# Patient Record
Sex: Male | Born: 1935 | Race: Black or African American | Hispanic: No | Marital: Married | Smoking: Former smoker
Health system: Southern US, Community
[De-identification: ages and names within clinical notes are randomized; demographics above are authoritative.]

## PROBLEM LIST (undated history)

## (undated) DIAGNOSIS — M109 Gout, unspecified: Secondary | ICD-10-CM

## (undated) DIAGNOSIS — M1990 Unspecified osteoarthritis, unspecified site: Secondary | ICD-10-CM

## (undated) DIAGNOSIS — G459 Transient cerebral ischemic attack, unspecified: Secondary | ICD-10-CM

## (undated) DIAGNOSIS — E039 Hypothyroidism, unspecified: Secondary | ICD-10-CM

## (undated) DIAGNOSIS — N289 Disorder of kidney and ureter, unspecified: Secondary | ICD-10-CM

## (undated) DIAGNOSIS — F0280 Dementia in other diseases classified elsewhere without behavioral disturbance: Secondary | ICD-10-CM

## (undated) DIAGNOSIS — G309 Alzheimer's disease, unspecified: Secondary | ICD-10-CM

## (undated) DIAGNOSIS — I10 Essential (primary) hypertension: Secondary | ICD-10-CM

## (undated) DIAGNOSIS — E7800 Pure hypercholesterolemia, unspecified: Secondary | ICD-10-CM

## (undated) DIAGNOSIS — D649 Anemia, unspecified: Secondary | ICD-10-CM

## (undated) DIAGNOSIS — K922 Gastrointestinal hemorrhage, unspecified: Secondary | ICD-10-CM

## (undated) DIAGNOSIS — N400 Enlarged prostate without lower urinary tract symptoms: Secondary | ICD-10-CM

## (undated) DIAGNOSIS — F32A Depression, unspecified: Secondary | ICD-10-CM

## (undated) DIAGNOSIS — I639 Cerebral infarction, unspecified: Secondary | ICD-10-CM

## (undated) DIAGNOSIS — K219 Gastro-esophageal reflux disease without esophagitis: Secondary | ICD-10-CM

## (undated) DIAGNOSIS — F0390 Unspecified dementia without behavioral disturbance: Secondary | ICD-10-CM

## (undated) DIAGNOSIS — F329 Major depressive disorder, single episode, unspecified: Secondary | ICD-10-CM

## (undated) DIAGNOSIS — J45909 Unspecified asthma, uncomplicated: Secondary | ICD-10-CM

## (undated) DIAGNOSIS — R0981 Nasal congestion: Secondary | ICD-10-CM

## (undated) DIAGNOSIS — Z9289 Personal history of other medical treatment: Secondary | ICD-10-CM

## (undated) HISTORY — PX: EYE SURGERY: SHX253

## (undated) HISTORY — PX: CATARACT EXTRACTION W/ INTRAOCULAR LENS  IMPLANT, BILATERAL: SHX1307

## (undated) HISTORY — PX: LITHOTRIPSY: SUR834

## (undated) HISTORY — PX: CYSTECTOMY: SUR359

---

## 1999-10-02 ENCOUNTER — Emergency Department (HOSPITAL_COMMUNITY): Admission: EM | Admit: 1999-10-02 | Discharge: 1999-10-02 | Payer: Self-pay | Admitting: Emergency Medicine

## 1999-10-09 ENCOUNTER — Encounter: Admission: RE | Admit: 1999-10-09 | Discharge: 1999-10-09 | Payer: Self-pay | Admitting: Internal Medicine

## 1999-10-28 ENCOUNTER — Emergency Department (HOSPITAL_COMMUNITY): Admission: EM | Admit: 1999-10-28 | Discharge: 1999-10-29 | Payer: Self-pay

## 2000-06-08 ENCOUNTER — Emergency Department (HOSPITAL_COMMUNITY): Admission: EM | Admit: 2000-06-08 | Discharge: 2000-06-08 | Payer: Self-pay | Admitting: Emergency Medicine

## 2000-06-08 ENCOUNTER — Encounter: Payer: Self-pay | Admitting: Emergency Medicine

## 2000-11-23 ENCOUNTER — Ambulatory Visit (HOSPITAL_COMMUNITY): Admission: RE | Admit: 2000-11-23 | Discharge: 2000-11-23 | Payer: Self-pay | Admitting: Specialist

## 2001-03-27 ENCOUNTER — Ambulatory Visit (HOSPITAL_COMMUNITY): Admission: RE | Admit: 2001-03-27 | Discharge: 2001-03-27 | Payer: Self-pay | Admitting: Specialist

## 2002-08-21 ENCOUNTER — Encounter: Payer: Self-pay | Admitting: Emergency Medicine

## 2002-08-21 ENCOUNTER — Emergency Department (HOSPITAL_COMMUNITY): Admission: EM | Admit: 2002-08-21 | Discharge: 2002-08-21 | Payer: Self-pay | Admitting: Emergency Medicine

## 2003-08-27 ENCOUNTER — Inpatient Hospital Stay (HOSPITAL_COMMUNITY): Admission: EM | Admit: 2003-08-27 | Discharge: 2003-09-03 | Payer: Self-pay

## 2003-09-26 ENCOUNTER — Ambulatory Visit (HOSPITAL_COMMUNITY): Admission: RE | Admit: 2003-09-26 | Discharge: 2003-09-26 | Payer: Self-pay | Admitting: Urology

## 2003-11-07 ENCOUNTER — Ambulatory Visit (HOSPITAL_COMMUNITY): Admission: RE | Admit: 2003-11-07 | Discharge: 2003-11-07 | Payer: Self-pay | Admitting: Urology

## 2004-03-19 ENCOUNTER — Inpatient Hospital Stay (HOSPITAL_COMMUNITY): Admission: EM | Admit: 2004-03-19 | Discharge: 2004-03-26 | Payer: Self-pay | Admitting: Emergency Medicine

## 2004-11-25 ENCOUNTER — Emergency Department (HOSPITAL_COMMUNITY): Admission: EM | Admit: 2004-11-25 | Discharge: 2004-11-25 | Payer: Self-pay | Admitting: Emergency Medicine

## 2005-03-14 ENCOUNTER — Emergency Department (HOSPITAL_COMMUNITY): Admission: EM | Admit: 2005-03-14 | Discharge: 2005-03-15 | Payer: Self-pay | Admitting: Emergency Medicine

## 2005-10-04 ENCOUNTER — Inpatient Hospital Stay (HOSPITAL_COMMUNITY): Admission: AD | Admit: 2005-10-04 | Discharge: 2005-10-06 | Payer: Self-pay | Admitting: Cardiovascular Disease

## 2005-10-05 ENCOUNTER — Encounter (INDEPENDENT_AMBULATORY_CARE_PROVIDER_SITE_OTHER): Payer: Self-pay | Admitting: Cardiovascular Disease

## 2005-12-20 ENCOUNTER — Emergency Department (HOSPITAL_COMMUNITY): Admission: EM | Admit: 2005-12-20 | Discharge: 2005-12-20 | Payer: Self-pay | Admitting: Emergency Medicine

## 2006-06-17 ENCOUNTER — Emergency Department (HOSPITAL_COMMUNITY): Admission: EM | Admit: 2006-06-17 | Discharge: 2006-06-17 | Payer: Self-pay | Admitting: *Deleted

## 2006-06-27 ENCOUNTER — Emergency Department (HOSPITAL_COMMUNITY): Admission: EM | Admit: 2006-06-27 | Discharge: 2006-06-27 | Payer: Self-pay | Admitting: Emergency Medicine

## 2006-10-26 ENCOUNTER — Observation Stay (HOSPITAL_COMMUNITY): Admission: EM | Admit: 2006-10-26 | Discharge: 2006-10-27 | Payer: Self-pay | Admitting: Emergency Medicine

## 2008-04-08 ENCOUNTER — Ambulatory Visit: Payer: Self-pay | Admitting: Vascular Surgery

## 2008-10-22 ENCOUNTER — Emergency Department (HOSPITAL_COMMUNITY): Admission: EM | Admit: 2008-10-22 | Discharge: 2008-10-22 | Payer: Self-pay | Admitting: Emergency Medicine

## 2008-10-23 ENCOUNTER — Inpatient Hospital Stay (HOSPITAL_COMMUNITY): Admission: EM | Admit: 2008-10-23 | Discharge: 2008-11-05 | Payer: Self-pay | Admitting: Emergency Medicine

## 2008-10-24 ENCOUNTER — Encounter (INDEPENDENT_AMBULATORY_CARE_PROVIDER_SITE_OTHER): Payer: Self-pay | Admitting: Internal Medicine

## 2008-10-24 ENCOUNTER — Ambulatory Visit: Payer: Self-pay | Admitting: Internal Medicine

## 2009-04-06 ENCOUNTER — Inpatient Hospital Stay (HOSPITAL_COMMUNITY): Admission: EM | Admit: 2009-04-06 | Discharge: 2009-04-08 | Payer: Self-pay | Admitting: Emergency Medicine

## 2010-02-15 ENCOUNTER — Encounter: Payer: Self-pay | Admitting: Urology

## 2010-02-15 ENCOUNTER — Encounter: Payer: Self-pay | Admitting: Internal Medicine

## 2010-04-17 LAB — BASIC METABOLIC PANEL
BUN: 21 mg/dL (ref 6–23)
CO2: 27 mEq/L (ref 19–32)
Calcium: 9 mg/dL (ref 8.4–10.5)
Chloride: 105 mEq/L (ref 96–112)
Chloride: 106 mEq/L (ref 96–112)
Chloride: 109 mEq/L (ref 96–112)
Creatinine, Ser: 2.25 mg/dL — ABNORMAL HIGH (ref 0.4–1.5)
Creatinine, Ser: 2.29 mg/dL — ABNORMAL HIGH (ref 0.4–1.5)
GFR calc Af Amer: 34 mL/min — ABNORMAL LOW (ref >60)
GFR calc Af Amer: 35 mL/min — ABNORMAL LOW (ref >60)
GFR calc non Af Amer: 27 mL/min — ABNORMAL LOW (ref >60)
GFR calc non Af Amer: 29 mL/min — ABNORMAL LOW (ref >60)
Glucose, Bld: 85 mg/dL (ref 70–99)
Potassium: 4.5 mEq/L (ref 3.5–5.1)
Sodium: 139 mEq/L (ref 135–145)
Sodium: 139 mEq/L (ref 135–145)
Sodium: 140 mEq/L (ref 135–145)

## 2010-04-17 LAB — CARDIAC PANEL(CRET KIN+CKTOT+MB+TROPI)
CK, MB: 2.2 ng/mL (ref 0.3–4.0)
Relative Index: 1.4 (ref 0.0–2.5)
Relative Index: 1.4 (ref 0.0–2.5)
Total CK: 160 U/L (ref 7–232)
Troponin I: 0.01 ng/mL (ref 0.00–0.06)
Troponin I: 0.02 ng/mL (ref 0.00–0.06)
Troponin I: <0.01 ng/mL (ref 0.00–0.06)

## 2010-04-17 LAB — COMPREHENSIVE METABOLIC PANEL
AST: 33 U/L (ref 0–37)
BUN: 20 mg/dL (ref 6–23)
CO2: 26 mEq/L (ref 19–32)
Chloride: 105 mEq/L (ref 96–112)
Creatinine, Ser: 2.24 mg/dL — ABNORMAL HIGH (ref 0.4–1.5)
Glucose, Bld: 124 mg/dL — ABNORMAL HIGH (ref 70–99)
Potassium: 4.3 mEq/L (ref 3.5–5.1)
Total Bilirubin: 0.6 mg/dL (ref 0.3–1.2)

## 2010-04-17 LAB — LIPID PANEL
LDL Cholesterol: 76 mg/dL (ref 0–99)
Total CHOL/HDL Ratio: 2.8 RATIO
Triglycerides: 51 mg/dL (ref <150)
VLDL: 10 mg/dL (ref 0–40)

## 2010-04-17 LAB — TROPONIN I: Troponin I: 0.02 ng/mL (ref 0.00–0.06)

## 2010-04-17 LAB — DIFFERENTIAL
Basophils Absolute: 0 10*3/uL (ref 0.0–0.1)
Basophils Relative: 0 % (ref 0–1)
Eosinophils Absolute: 0.2 10*3/uL (ref 0.0–0.7)
Lymphs Abs: 1.4 10*3/uL (ref 0.7–4.0)
Monocytes Absolute: 0.6 10*3/uL (ref 0.1–1.0)
Neutrophils Relative %: 59 % (ref 43–77)

## 2010-04-17 LAB — POCT CARDIAC MARKERS: CKMB, poc: 1.7 ng/mL (ref 1.0–8.0)

## 2010-04-17 LAB — CBC
Hemoglobin: 13.1 g/dL (ref 13.0–17.0)
MCHC: 31.8 g/dL (ref 30.0–36.0)
MCHC: 32.2 g/dL (ref 30.0–36.0)
MCV: 96.7 fL (ref 78.0–100.0)
Platelets: 176 10*3/uL (ref 150–400)
RBC: 4.28 MIL/uL (ref 4.22–5.81)
RDW: 16.6 % — ABNORMAL HIGH (ref 11.5–15.5)

## 2010-04-17 LAB — CK TOTAL AND CKMB (NOT AT ARMC): CK, MB: 2.5 ng/mL (ref 0.3–4.0)

## 2010-04-17 LAB — HEMOCCULT GUIAC POC 1CARD (OFFICE): Fecal Occult Bld: NEGATIVE

## 2010-04-17 LAB — APTT: aPTT: 23 seconds — ABNORMAL LOW (ref 24–37)

## 2010-04-17 LAB — PROTIME-INR: Prothrombin Time: 13.6 seconds (ref 11.6–15.2)

## 2010-04-30 LAB — CBC
HCT: 23.3 % — ABNORMAL LOW (ref 39.0–52.0)
HCT: 25 % — ABNORMAL LOW (ref 39.0–52.0)
HCT: 25.4 % — ABNORMAL LOW (ref 39.0–52.0)
HCT: 25.9 % — ABNORMAL LOW (ref 39.0–52.0)
HCT: 26.1 % — ABNORMAL LOW (ref 39.0–52.0)
HCT: 26.2 % — ABNORMAL LOW (ref 39.0–52.0)
HCT: 26.2 % — ABNORMAL LOW (ref 39.0–52.0)
HCT: 26.9 % — ABNORMAL LOW (ref 39.0–52.0)
HCT: 27 % — ABNORMAL LOW (ref 39.0–52.0)
HCT: 29.3 % — ABNORMAL LOW (ref 39.0–52.0)
Hemoglobin: 7.9 g/dL — CL (ref 13.0–17.0)
Hemoglobin: 8.6 g/dL — ABNORMAL LOW (ref 13.0–17.0)
Hemoglobin: 8.8 g/dL — ABNORMAL LOW (ref 13.0–17.0)
Hemoglobin: 8.8 g/dL — ABNORMAL LOW (ref 13.0–17.0)
Hemoglobin: 8.8 g/dL — ABNORMAL LOW (ref 13.0–17.0)
Hemoglobin: 8.9 g/dL — ABNORMAL LOW (ref 13.0–17.0)
Hemoglobin: 9 g/dL — ABNORMAL LOW (ref 13.0–17.0)
Hemoglobin: 9 g/dL — ABNORMAL LOW (ref 13.0–17.0)
Hemoglobin: 9.1 g/dL — ABNORMAL LOW (ref 13.0–17.0)
Hemoglobin: 9.2 g/dL — ABNORMAL LOW (ref 13.0–17.0)
Hemoglobin: 9.8 g/dL — ABNORMAL LOW (ref 13.0–17.0)
MCHC: 33.4 g/dL (ref 30.0–36.0)
MCHC: 33.7 g/dL (ref 30.0–36.0)
MCHC: 33.9 g/dL (ref 30.0–36.0)
MCHC: 33.9 g/dL (ref 30.0–36.0)
MCHC: 34.1 g/dL (ref 30.0–36.0)
MCHC: 34.5 g/dL (ref 30.0–36.0)
MCHC: 34.6 g/dL (ref 30.0–36.0)
MCHC: 34.7 g/dL (ref 30.0–36.0)
MCV: 88.8 fL (ref 78.0–100.0)
MCV: 89.4 fL (ref 78.0–100.0)
MCV: 89.9 fL (ref 78.0–100.0)
MCV: 89.9 fL (ref 78.0–100.0)
MCV: 90.4 fL (ref 78.0–100.0)
MCV: 90.6 fL (ref 78.0–100.0)
Platelets: 131 10*3/uL — ABNORMAL LOW (ref 150–400)
Platelets: 140 10*3/uL — ABNORMAL LOW (ref 150–400)
Platelets: 178 10*3/uL (ref 150–400)
Platelets: 220 10*3/uL (ref 150–400)
Platelets: 259 10*3/uL (ref 150–400)
Platelets: 267 10*3/uL (ref 150–400)
Platelets: 281 10*3/uL (ref 150–400)
Platelets: 323 10*3/uL (ref 150–400)
RBC: 2.67 MIL/uL — ABNORMAL LOW (ref 4.22–5.81)
RBC: 2.78 MIL/uL — ABNORMAL LOW (ref 4.22–5.81)
RBC: 2.85 MIL/uL — ABNORMAL LOW (ref 4.22–5.81)
RBC: 2.9 MIL/uL — ABNORMAL LOW (ref 4.22–5.81)
RBC: 2.92 MIL/uL — ABNORMAL LOW (ref 4.22–5.81)
RBC: 2.95 MIL/uL — ABNORMAL LOW (ref 4.22–5.81)
RBC: 2.95 MIL/uL — ABNORMAL LOW (ref 4.22–5.81)
RBC: 3.24 MIL/uL — ABNORMAL LOW (ref 4.22–5.81)
RDW: 15.6 % — ABNORMAL HIGH (ref 11.5–15.5)
RDW: 15.9 % — ABNORMAL HIGH (ref 11.5–15.5)
RDW: 15.9 % — ABNORMAL HIGH (ref 11.5–15.5)
RDW: 16 % — ABNORMAL HIGH (ref 11.5–15.5)
RDW: 16.1 % — ABNORMAL HIGH (ref 11.5–15.5)
RDW: 16.1 % — ABNORMAL HIGH (ref 11.5–15.5)
RDW: 16.2 % — ABNORMAL HIGH (ref 11.5–15.5)
RDW: 16.2 % — ABNORMAL HIGH (ref 11.5–15.5)
RDW: 16.3 % — ABNORMAL HIGH (ref 11.5–15.5)
RDW: 16.5 % — ABNORMAL HIGH (ref 11.5–15.5)
RDW: 17.3 % — ABNORMAL HIGH (ref 11.5–15.5)
WBC: 10.5 10*3/uL (ref 4.0–10.5)
WBC: 11.5 10*3/uL — ABNORMAL HIGH (ref 4.0–10.5)
WBC: 12.1 10*3/uL — ABNORMAL HIGH (ref 4.0–10.5)
WBC: 12.5 10*3/uL — ABNORMAL HIGH (ref 4.0–10.5)
WBC: 13.4 10*3/uL — ABNORMAL HIGH (ref 4.0–10.5)
WBC: 14.2 10*3/uL — ABNORMAL HIGH (ref 4.0–10.5)

## 2010-04-30 LAB — URINE CULTURE
Colony Count: NO GROWTH
Special Requests: NEGATIVE
Special Requests: NEGATIVE

## 2010-04-30 LAB — URINE MICROSCOPIC-ADD ON

## 2010-04-30 LAB — URINALYSIS, ROUTINE W REFLEX MICROSCOPIC
Bilirubin Urine: NEGATIVE
Bilirubin Urine: NEGATIVE
Glucose, UA: NEGATIVE mg/dL
Glucose, UA: NEGATIVE mg/dL
Ketones, ur: NEGATIVE mg/dL
Ketones, ur: NEGATIVE mg/dL
Protein, ur: 100 mg/dL — AB
pH: 5 (ref 5.0–8.0)
pH: 6.5 (ref 5.0–8.0)

## 2010-04-30 LAB — DIFFERENTIAL
Basophils Absolute: 0 10*3/uL (ref 0.0–0.1)
Basophils Relative: 0 % (ref 0–1)
Eosinophils Absolute: 0.5 10*3/uL (ref 0.0–0.7)
Eosinophils Relative: 1 % (ref 0–5)
Lymphocytes Relative: 7 % — ABNORMAL LOW (ref 12–46)
Lymphs Abs: 0.8 10*3/uL (ref 0.7–4.0)
Monocytes Absolute: 0.7 10*3/uL (ref 0.1–1.0)
Monocytes Absolute: 0.8 10*3/uL (ref 0.1–1.0)
Monocytes Relative: 7 % (ref 3–12)
Neutro Abs: 8.3 10*3/uL — ABNORMAL HIGH (ref 1.7–7.7)
Neutro Abs: 9.8 10*3/uL — ABNORMAL HIGH (ref 1.7–7.7)

## 2010-04-30 LAB — CARDIAC PANEL(CRET KIN+CKTOT+MB+TROPI)
CK, MB: 1.9 ng/mL (ref 0.3–4.0)
CK, MB: 2.2 ng/mL (ref 0.3–4.0)
Relative Index: 1.4 (ref 0.0–2.5)
Relative Index: 1.9 (ref 0.0–2.5)
Total CK: 100 U/L (ref 7–232)
Total CK: 125 U/L (ref 7–232)
Troponin I: 0.01 ng/mL (ref 0.00–0.06)
Troponin I: <0.01 ng/mL (ref 0.00–0.06)

## 2010-04-30 LAB — BASIC METABOLIC PANEL
BUN: 15 mg/dL (ref 6–23)
BUN: 15 mg/dL (ref 6–23)
BUN: 16 mg/dL (ref 6–23)
BUN: 20 mg/dL (ref 6–23)
BUN: 25 mg/dL — ABNORMAL HIGH (ref 6–23)
CO2: 22 mEq/L (ref 19–32)
CO2: 23 mEq/L (ref 19–32)
CO2: 24 mEq/L (ref 19–32)
Calcium: 7.5 mg/dL — ABNORMAL LOW (ref 8.4–10.5)
Calcium: 7.6 mg/dL — ABNORMAL LOW (ref 8.4–10.5)
Calcium: 8.1 mg/dL — ABNORMAL LOW (ref 8.4–10.5)
Calcium: 8.1 mg/dL — ABNORMAL LOW (ref 8.4–10.5)
Calcium: 8.3 mg/dL — ABNORMAL LOW (ref 8.4–10.5)
Calcium: 8.4 mg/dL (ref 8.4–10.5)
Calcium: 8.4 mg/dL (ref 8.4–10.5)
Chloride: 108 mEq/L (ref 96–112)
Chloride: 110 mEq/L (ref 96–112)
Creatinine, Ser: 2.04 mg/dL — ABNORMAL HIGH (ref 0.4–1.5)
Creatinine, Ser: 2.16 mg/dL — ABNORMAL HIGH (ref 0.4–1.5)
Creatinine, Ser: 2.2 mg/dL — ABNORMAL HIGH (ref 0.4–1.5)
Creatinine, Ser: 2.39 mg/dL — ABNORMAL HIGH (ref 0.4–1.5)
GFR calc Af Amer: 31 mL/min — ABNORMAL LOW (ref >60)
GFR calc Af Amer: 37 mL/min — ABNORMAL LOW (ref >60)
GFR calc Af Amer: 38 mL/min — ABNORMAL LOW (ref >60)
GFR calc Af Amer: 38 mL/min — ABNORMAL LOW (ref >60)
GFR calc non Af Amer: 28 mL/min — ABNORMAL LOW (ref >60)
GFR calc non Af Amer: 29 mL/min — ABNORMAL LOW (ref >60)
GFR calc non Af Amer: 30 mL/min — ABNORMAL LOW (ref >60)
GFR calc non Af Amer: 31 mL/min — ABNORMAL LOW (ref >60)
GFR calc non Af Amer: 31 mL/min — ABNORMAL LOW (ref >60)
GFR calc non Af Amer: 31 mL/min — ABNORMAL LOW (ref >60)
GFR calc non Af Amer: 31 mL/min — ABNORMAL LOW (ref >60)
Glucose, Bld: 105 mg/dL — ABNORMAL HIGH (ref 70–99)
Glucose, Bld: 111 mg/dL — ABNORMAL HIGH (ref 70–99)
Glucose, Bld: 112 mg/dL — ABNORMAL HIGH (ref 70–99)
Glucose, Bld: 121 mg/dL — ABNORMAL HIGH (ref 70–99)
Glucose, Bld: 122 mg/dL — ABNORMAL HIGH (ref 70–99)
Glucose, Bld: 123 mg/dL — ABNORMAL HIGH (ref 70–99)
Glucose, Bld: 127 mg/dL — ABNORMAL HIGH (ref 70–99)
Glucose, Bld: 152 mg/dL — ABNORMAL HIGH (ref 70–99)
Potassium: 3.4 mEq/L — ABNORMAL LOW (ref 3.5–5.1)
Potassium: 3.4 mEq/L — ABNORMAL LOW (ref 3.5–5.1)
Potassium: 3.7 mEq/L (ref 3.5–5.1)
Potassium: 3.9 mEq/L (ref 3.5–5.1)
Potassium: 4 mEq/L (ref 3.5–5.1)
Sodium: 137 mEq/L (ref 135–145)
Sodium: 138 mEq/L (ref 135–145)
Sodium: 139 mEq/L (ref 135–145)
Sodium: 140 mEq/L (ref 135–145)
Sodium: 140 mEq/L (ref 135–145)

## 2010-04-30 LAB — CROSSMATCH: ABO/RH(D): A NEG

## 2010-04-30 LAB — CULTURE, BLOOD (ROUTINE X 2): Culture: NO GROWTH

## 2010-04-30 LAB — PREPARE PLATELETS

## 2010-04-30 LAB — HEMOGLOBIN: Hemoglobin: 8.9 g/dL — ABNORMAL LOW (ref 13.0–17.0)

## 2010-04-30 LAB — GLUCOSE, CAPILLARY

## 2010-05-01 LAB — POCT CARDIAC MARKERS
CKMB, poc: 1.3 ng/mL (ref 1.0–8.0)
CKMB, poc: <1 ng/mL — ABNORMAL LOW (ref 1.0–8.0)
Myoglobin, poc: 196 ng/mL (ref 12–200)
Myoglobin, poc: 200 ng/mL (ref 12–200)

## 2010-05-01 LAB — TYPE AND SCREEN: Antibody Screen: NEGATIVE

## 2010-05-01 LAB — BASIC METABOLIC PANEL
BUN: 35 mg/dL — ABNORMAL HIGH (ref 6–23)
BUN: 36 mg/dL — ABNORMAL HIGH (ref 6–23)
CO2: 17 mEq/L — ABNORMAL LOW (ref 19–32)
Chloride: 113 mEq/L — ABNORMAL HIGH (ref 96–112)
GFR calc non Af Amer: 24 mL/min — ABNORMAL LOW (ref >60)
Glucose, Bld: 156 mg/dL — ABNORMAL HIGH (ref 70–99)
Glucose, Bld: 220 mg/dL — ABNORMAL HIGH (ref 70–99)
Potassium: 4.4 mEq/L (ref 3.5–5.1)
Potassium: 5 mEq/L (ref 3.5–5.1)

## 2010-05-01 LAB — CBC
HCT: 25 % — ABNORMAL LOW (ref 39.0–52.0)
Hemoglobin: 10.1 g/dL — ABNORMAL LOW (ref 13.0–17.0)
Hemoglobin: 14.8 g/dL (ref 13.0–17.0)
MCHC: 33.4 g/dL (ref 30.0–36.0)
MCHC: 33.4 g/dL (ref 30.0–36.0)
MCHC: 33.5 g/dL (ref 30.0–36.0)
MCHC: 33.9 g/dL (ref 30.0–36.0)
MCV: 90.9 fL (ref 78.0–100.0)
MCV: 91.1 fL (ref 78.0–100.0)
MCV: 94.3 fL (ref 78.0–100.0)
MCV: 98.7 fL (ref 78.0–100.0)
Platelets: 124 10*3/uL — ABNORMAL LOW (ref 150–400)
Platelets: 149 10*3/uL — ABNORMAL LOW (ref 150–400)
Platelets: 175 10*3/uL (ref 150–400)
Platelets: 188 10*3/uL (ref 150–400)
Platelets: 193 10*3/uL (ref 150–400)
Platelets: 209 10*3/uL (ref 150–400)
RBC: 2.59 MIL/uL — ABNORMAL LOW (ref 4.22–5.81)
RBC: 3.32 MIL/uL — ABNORMAL LOW (ref 4.22–5.81)
RBC: 3.33 MIL/uL — ABNORMAL LOW (ref 4.22–5.81)
RBC: 3.39 MIL/uL — ABNORMAL LOW (ref 4.22–5.81)
RDW: 14.3 % (ref 11.5–15.5)
RDW: 18.4 % — ABNORMAL HIGH (ref 11.5–15.5)
RDW: 20.5 % — ABNORMAL HIGH (ref 11.5–15.5)
WBC: 15.1 10*3/uL — ABNORMAL HIGH (ref 4.0–10.5)
WBC: 15.9 10*3/uL — ABNORMAL HIGH (ref 4.0–10.5)

## 2010-05-01 LAB — COMPREHENSIVE METABOLIC PANEL
ALT: 20 U/L (ref 0–53)
ALT: 21 U/L (ref 0–53)
AST: 25 U/L (ref 0–37)
AST: 26 U/L (ref 0–37)
Albumin: 3.1 g/dL — ABNORMAL LOW (ref 3.5–5.2)
Albumin: 3.6 g/dL (ref 3.5–5.2)
Alkaline Phosphatase: 101 U/L (ref 39–117)
CO2: 19 mEq/L (ref 19–32)
Calcium: 8.7 mg/dL (ref 8.4–10.5)
Creatinine, Ser: 2.92 mg/dL — ABNORMAL HIGH (ref 0.4–1.5)
GFR calc Af Amer: 25 mL/min — ABNORMAL LOW (ref >60)
GFR calc Af Amer: 28 mL/min — ABNORMAL LOW (ref >60)
GFR calc non Af Amer: 21 mL/min — ABNORMAL LOW (ref >60)
Potassium: 5 mEq/L (ref 3.5–5.1)
Sodium: 140 mEq/L (ref 135–145)
Sodium: 141 mEq/L (ref 135–145)
Total Protein: 5.9 g/dL — ABNORMAL LOW (ref 6.0–8.3)
Total Protein: 7.2 g/dL (ref 6.0–8.3)

## 2010-05-01 LAB — PROTIME-INR
INR: 1.3 (ref 0.00–1.49)
Prothrombin Time: 15.2 seconds (ref 11.6–15.2)
Prothrombin Time: 16.3 seconds — ABNORMAL HIGH (ref 11.6–15.2)

## 2010-05-01 LAB — DIFFERENTIAL
Basophils Relative: 0 % (ref 0–1)
Eosinophils Absolute: 0 10*3/uL (ref 0.0–0.7)
Eosinophils Absolute: 0 10*3/uL (ref 0.0–0.7)
Eosinophils Relative: 0 % (ref 0–5)
Lymphocytes Relative: 19 % (ref 12–46)
Lymphs Abs: 1 10*3/uL (ref 0.7–4.0)
Lymphs Abs: 1.5 10*3/uL (ref 0.7–4.0)
Lymphs Abs: 1.9 10*3/uL (ref 0.7–4.0)
Monocytes Absolute: 0.4 10*3/uL (ref 0.1–1.0)
Monocytes Absolute: 0.6 10*3/uL (ref 0.1–1.0)
Monocytes Relative: 6 % (ref 3–12)
Monocytes Relative: 6 % (ref 3–12)
Monocytes Relative: 9 % (ref 3–12)
Neutro Abs: 12.2 10*3/uL — ABNORMAL HIGH (ref 1.7–7.7)
Neutrophils Relative %: 81 % — ABNORMAL HIGH (ref 43–77)

## 2010-05-01 LAB — CARDIAC PANEL(CRET KIN+CKTOT+MB+TROPI)
Relative Index: INVALID (ref 0.0–2.5)
Relative Index: INVALID (ref 0.0–2.5)
Relative Index: INVALID (ref 0.0–2.5)
Total CK: 78 U/L (ref 7–232)
Troponin I: <0.01 ng/mL (ref 0.00–0.06)

## 2010-05-01 LAB — URINALYSIS, ROUTINE W REFLEX MICROSCOPIC
Glucose, UA: NEGATIVE mg/dL
Ketones, ur: NEGATIVE mg/dL
Protein, ur: NEGATIVE mg/dL
pH: 5.5 (ref 5.0–8.0)

## 2010-05-01 LAB — URINE MICROSCOPIC-ADD ON

## 2010-05-01 LAB — PREPARE PLATELETS

## 2010-05-01 LAB — APTT: aPTT: 25 seconds (ref 24–37)

## 2010-05-01 LAB — RETICULOCYTES
RBC.: 3.34 MIL/uL — ABNORMAL LOW (ref 4.22–5.81)
Retic Count, Absolute: 50.1 10*3/uL (ref 19.0–186.0)

## 2010-05-01 LAB — IRON AND TIBC: Iron: 87 ug/dL (ref 42–135)

## 2010-05-01 LAB — VITAMIN B12: Vitamin B-12: 1020 pg/mL — ABNORMAL HIGH (ref 211–911)

## 2010-05-01 LAB — URINE CULTURE
Colony Count: NO GROWTH
Culture: NO GROWTH

## 2010-05-01 LAB — LIPID PANEL
Cholesterol: 80 mg/dL (ref 0–200)
LDL Cholesterol: 42 mg/dL (ref 0–99)
Total CHOL/HDL Ratio: 5 RATIO

## 2010-05-01 LAB — HEMOCCULT GUIAC POC 1CARD (OFFICE): Fecal Occult Bld: POSITIVE

## 2010-06-12 NOTE — Discharge Summary (Signed)
[name] [name], HEWES NO.:  [id number]   MEDICAL RECORD NO.:  [id number]          PATIENT TYPE:  OBV   LOCATION:  2027                         FACILITY:  [facility]   PHYSICIAN:  Ricki Rodriguez, M.D.  DATE OF BIRTH:  1919/08/05   DATE OF ADMISSION:  10/26/2006  DATE OF DISCHARGE:  10/27/2006                               DISCHARGE SUMMARY   FINAL DIAGNOSES:  1. Chest pain.  2. Hypertension.  3. Dementia.  4. Hyperlipidemia.  5. Obesity.   DISCHARGE MEDICATIONS:  1. Aspirin 81 mg one daily.  2. Plavix 75 mg one daily.  3. Nortriptyline 25 mg at bedtime.  4. Zocor 20 mg one daily.  5. Atacand 8 mg one daily.  6. Mirtazapine 45 mg at bedtime.  7. Aricept 10 mg daily.  8. Namenda 10 mg one twice daily.  9. Prilosec 20 mg one daily.  10.Flomax 0.4 mg one daily.  11.Vicodin 5/500 mg one twice daily as needed.  12.Robaxin 500 mg one twice daily as needed.  13.Bactrim DS one daily.  14.Nitrofurantoin 50 mg one every other day.  15.Skelaxin 800 mg twice daily as needed.  16.Lorazepam 1 mg one daily as needed.  17.Multivitamin one daily.   FOLLOW-UP:  1. By Dr. Orpah Cobb in 2 weeks.  2. By Dr. Concepcion Elk in 1-2 months.   The patient and family to call the doctor's offices respectively for  appointment.   DISCHARGE DIET:  Low-sodium, heart-healthy diet.   DISCHARGE ACTIVITY:  The patient to increase activity slowly and to  avoid any activity that causes chest pain, shortness of breath,  dizziness, sweating or excessive weakness.   HISTORY:  This 77 year old black male presented with left-sided chest  pain x2 days without shortness of breath or radiation.  Patient unable  to provide a good history due to dementia.   PHYSICAL EXAMINATION:  Temperature 97, pulse 97, respiration 18, blood  pressure 144/76.  GENERAL:  The patient is normocephalic, atraumatic, with frontal  baldness and wears a hat.  EYES:  Manson Passey.  Pupils equally reactive to light.   Extraocular movement  intact.  A positive arcus senilis.  Wears upper and lower dentures.  Has  bilateral lens implants.  NECK:  No JVD, no carotid bruit.  LUNGS:  Clear bilaterally.  HEART:  Normal S1-S2.  ABDOMEN:  Soft and nontender.  EXTREMITIES:  Trace edema without cyanosis or clubbing.  SKIN:  Warm and dry.  NEUROLOGIC:  The patient moves all four extremities.   LABORATORY DATA:  Normal hemoglobin, hematocrit, WBC count, platelet  count.  Normal electrolytes, BUN 22, creatinine elevated at 2.1, glucose  113.  CK, MB negative x3 and troponin negative x3.   Chest x-ray:  No acute cardiopulmonary abnormality.   HOSPITAL COURSE:  The patient was placed in observation unit.  His CK,  MB and troponin I were negative x3.  He was offered cardiac  catheterization versus nuclear stress test.  The patient declined any  additional workup at this time and he was discharged home in  satisfactory condition with follow-up by me in 2 weeks.  Ricki Rodriguez, M.D.  Electronically Signed     ASK/MEDQ  D:  12/05/2006  T:  12/06/2006  Job:  [id number]

## 2010-06-12 NOTE — Consult Note (Signed)
NAME:  [name] [name], [name] [name] NO.:  [id number]   MEDICAL RECORD NO.:  [id number]                   PATIENT TYPE:  INP   LOCATION:  13                                 FACILITY:  [facility]   PHYSICIAN:  Rozanna Boer., M.D.      DATE OF BIRTH:  11/14/1917   DATE OF CONSULTATION:  08/28/2003  DATE OF DISCHARGE:                                   CONSULTATION   REFERRING PHYSICIAN:  Fleet Contras, M.D.   REASON FOR CONSULTATION:  Right hydronephrosis, gram-negative septicemia,  and bilateral renal calculi, urinary retention, elevated PSA.   This 77 year old patient was admitted yesterday with a 105 fever from the  ER.  He had had pain in the left flank but he had been seen by Dr. Concepcion Elk  on July 26 with similar complaints but pain was less severe.  He had some  urinary frequency, incontinence, and cloudy, foul-smelling urine at that  time.  A CT scan of the abdomen was requested, but the patient never showed  up.  The patient's wife said that he had not been eating or drinking much  lately and felt more and more weak and went to the bathroom frequently.  He  went to the ER, where his temperature was 105, his creatinine was 2.3, and  he had a leukocytosis on his CBC.  He was admitted for further treatment and  care.  Since he has been in the hospital, an ultrasound showed right renal  obstruction with bilateral renal calculi.  The hydronephrosis was mild on  the right side.  Later today the lab called and said he had gram-negative  septicemia growing out of his blood culture, and he has been in urinary  retention with a Foley catheter.  He also has a history of an elevated PSA  in the past.  It was 7.6 last winter.  His white count has gone up to  21,000, and his creatinine history gone up from 2.3 to 3.1 today.  A CT scan  stone protocol has been ordered but has not yet been done.   The patient's wife said that he had a previous lithotripsy in [place]  about for or five years ago but no etiology of his stones was determined,  and she does not know whether he had other stones at that time.  He has had  urinary tract infection and was treated a month or two ago as well.   His other medical history:  1. He does have Alzheimer's disease with dementia.  2. History of Parkinson's disease.  3. History of depression.  4. History of elevated PSA with BPH.  5. History of GERD.  6. History of a liver nodule.   His medicines on admission were:  1. Aricept 5 mg daily.  2. Namenda 10 mg two p.o. b.i.d.  3. Aspirin 81 mg.  4. Lexapro 10 mg a day.  5. Nexium 40 mg a  day.  6. Zocor 20 mg a day.   He has no known drug allergies.   No other operations other than a cyst on his neck, which was removed  sometime in the past.   FAMILY HISTORY, SOCIAL HISTORY, AND REVIEW OF SYSTEMS:  He lives with his  wife.  He has seven children in good health.  His daughter is with him as  well as his son-in-law.  Does not smoke or drink alcohol   He has no cardiac or pulmonary symptomatology.  His dementia has been  getting worse and according to his wife, he does not like doctors and is  very noncompliant by history.   PHYSICAL EXAMINATION:  VITAL SIGNS:  His temperature is 100.5, his blood  pressure is 95/62, pulse 108, respirations 20.  GENERAL:  He is a fairly confused, somewhat combative but afebrile black  male in no acute distress.  Head is shaved.  HEENT:  Clear.  No lesions of the oropharynx.  NECK:  Supple.  A scar on his neck from previous surgery.  CHEST:  Clear.  ABDOMEN:  Soft.  Liver, spleen not palpable.  No CVA pain on either side.  Bladder is not distended.  GENITOURINARY:  His penis is uncircumcised with bilaterally descended  testes.  His Foley catheter is in place, draining clear urine.  His prostate  is not examined at this time for patient not wanting me to do so.  EXTREMITIES:  Negative, no edema, good distal pulses.    IMPRESSION:  1. Bilateral renal calculi.  2. Right hydronephrosis.  3. Obstructive uropathy.  4. Azotemia.  5. Gram-negative sepsis.  6. Elevated prostate-specific antigen by history.  7. Alzheimer's with dementia.   RECOMMENDATIONS:  The patient seems stable enough now to wait and do  cystoscopy and stent placement and possible ureteroscopy tomorrow at [facility].  This was scheduled for 12 noon.  Will arrange for [facility] transfer  to take him over and back.  That way all the equipment I would need would be  available, which is not available at [facility] here at this time.  Further  disposition will be made after the cystoscopy and retrogrades and stent  placement and possible ureteroscopy.                                               Rozanna Boer., M.D.    HMK/MEDQ  D:  08/28/2003  T:  08/29/2003  Job:  [id number]   cc:   Fleet Contras, M.D.  [address]  [place]  [place] [address]  Fax: [phone]

## 2010-06-12 NOTE — H&P (Signed)
NAME:  [name] [name], [name] [name] NO.:  [id number]   MEDICAL RECORD NO.:  [id number]                   PATIENT TYPE:  OUT   LOCATION:  CATS                                 FACILITY:  [facility]   PHYSICIAN:  Fleet Contras, M.D.                 DATE OF BIRTH:  11/14/1917   DATE OF ADMISSION:  08/27/2003  DATE OF DISCHARGE:                                HISTORY & PHYSICAL   [name] [name] is an 77 year old African American gentleman admitted via the  emergency room  at [facility].  He is brought in by his wife today  with complaints of persistent pain in the left flank associated with high  fever.  He was seen in my office on August 20, 2003, with similar complaints  but pain was less severe.  He had some urinary frequency, incontinence, and  cloudy, foul smelling urine.  Urinalysis at that time showed 1+ leukocytes,  no nitrites, and large blood.  He was thought to have developed a urinary  tract infection and was started on antibiotics with ciprofloxacin 250 mg 1  p.o. q.12h.  A CT scan of the abdomen and pelvis was requested and this was  scheduled for yesterday, August 26, 2003, but the patient did not attend.  The patient's wife said that the patient has not been eating or drinking  fluids as much as usual in the last couple of days and has become  progressively weak and has no been going to the bathroom as frequently as  usual.  He started having high fevers today and she decided to bring him to  the emergency room.  On evaluation in the emergency room, he is noted to be  not in acute respiratory or painful distress, he is alert and oriented.  He  is febrile up to 105 degrees Fahrenheit and his other hemodynamic studies  are, otherwise, normal.  His initial laboratory data returns essentially  normal except for elevated creatinine of 2.3.  He does not have any  leukocytosis on his CBC and his lipase level is normal.  He is being  hospitalized for presumptive  urosepsis and obstructive uropathy.   PAST MEDICAL HISTORY:  Significant for  1. Alzheimer's dementia.  2. History of Parkinson's disease.  3. History of benign prostatic hypertrophy with elevated PSA.  4. History of depression.  5. History of gastroesophageal reflux disease.  6. History of dyslipidemia.  7. History of liver nodule.   PAST SURGICAL HISTORY:  Noncontributory.   MEDICATIONS:  He is on  1. Aricept 5 mg daily.  2. Namenda 10 mg 2 p.o. b.i.d.  3. Aspirin 81 mg daily.  4. Lexapro 10 mg daily.  5. Nexium 40 mg daily.  6. Zocor 20 mg daily.   ALLERGIES:  He has no known drug allergies.   FAMILY AND SOCIAL HISTORY:  He currently lives with his wife.  He  has seven  children in good health.  He does not smoke, drink alcohol, or use illicit  drugs.  He has no advance directives on record.   REVIEW OF SYMPTOMS:  CNS:  He denies any headaches, dizziness, or limb  weakness.  CVS:  Denies any chest pain, shortness of breath, orthopnea, or  PND.  RESPIRATORY:  He denies any cough, sputum production, or hemoptysis.  GI:  He has no nausea, vomiting, diarrhea, or constipation.  GU:  As above.  MUSCULOSKELETAL:  He denies any joint pain or swelling.   PHYSICAL EXAMINATION:  GENERAL:  Elderly gentleman, alert but confused and disoriented.  VITAL SIGNS:  Stable except for temperature of 103.  He is mildly  dehydrated.  He is not icteric and not cyanotic.  HEENT:  Pupils equal, round, reactive to light and accommodation.  NECK:  Supple with no elevated JVD, no cervical lymphadenopathy, no carotid  bruit.  CHEST:  Good air entry bilaterally with no added sounds.  HEART:  S1 and S2 are heard with no murmurs.  ABDOMEN:  Obese, soft, there is tenderness over the left flank, no palpable  masses, no deformities, bowel sounds present.  EXTREMITIES:  No edema, no calf tenderness or swelling.  Peripheral pulses  are present bilaterally.  CNS:  No focal neurological deficit.    LABORATORY DATA:  Complete blood count shows white count 5.1, hemoglobin 14,  hematocrit 41, platelet count 184.  There is a preponderance of neutrophilia  of 93%.  Chemistries showed sodium 142, potassium 3.5, chloride 110,  bicarbonate 20, glucose 132, BUN 15, creatinine 2.3, bilirubin 1, alkaline  phosphatase 152, SGOT 47, SGPT 32, albumin 3.3, calcium 9, amylase 61,  lipase 21.  Urinalysis has been requested but not yet available.  Blood  culture have been obtained.   ASSESSMENT:  [name] [name] is an 77 year old African American gentleman  with past medical history of benign prostatic hypertrophy with elevated PSA  presented to the emergency room  at [facility] with complaints of  left flank pain associated with hematuria and high fever.  He has been  catheterized but very little urine has been obtained and this is bloody.  He  is thought to have developed obstructive uropathy associated with urinary  tract infection, possibly pyelonephritis.   ADMISSION DIAGNOSIS:  1. Urosepsis.  2. Obstructive uropathy.  3. Acute renal insufficiency.  4. Dehydration.   PLAN:  The patient will be admitted to a medical bed.  He will be placed on  regular diet, vital signs q.4h.  Further studies to evaluate the upper  urinary tract including the kidney, ureters, and bladder, will be done by  ultrasound scan to look for obstruction.  He will be placed on his home  medications, gentle hydration, and monitoring of inputs and outputs.  He  will, most likely, require urologic evaluation in the morning.  His plan of  care has been explained to him and his wife and their questions have been  answered.                                                Fleet Contras, M.D.    EA/MEDQ  D:  08/27/2003  T:  08/27/2003  Job:  [id number]

## 2010-06-12 NOTE — Discharge Summary (Signed)
NAME:  [name], [name] NO.:  [id number]   MEDICAL RECORD NO.:  [id number]          PATIENT TYPE:  INP   LOCATION:  5009                         FACILITY:  [facility]   PHYSICIAN:  Fleet Contras, M.D.    DATE OF BIRTH:  1919/08/20   DATE OF ADMISSION:  03/19/2004  DATE OF DISCHARGE:  03/26/2004                                 DISCHARGE SUMMARY   HISTORY OF PRESENT ILLNESS:  Mr. [name] [name] is an 77 year old African American  gentleman via the emergency room to [facility] who  presented with history of chills, fevers, weak for one night. Apparently,  his wife stated that he was confused for a few seconds. She called EMS. He  had no slurring of speech. No weakness of strength. He had not been eating  well for a few days and had not drank much fluids prior to his deterioration  the night of presentation. Had no abdominal pain, diarrhea, vomiting. No  chest pain, shortness of breath, palpitations.   In the emergency room, he was noted to be dehydrated. Blood pressure was  100/50, heart rate 80, temperature 97.7. Initial laboratory data showed  white count of 3.5, hemoglobin 12.2. Sodium was 146, potassium was 3.5. BUN  was 19, creatinine was 1, AST 1014, ALT 757. Total bilirubin 0.8, albumin  2.1. Urinalysis was positive for nitrites, leukocyte esterase, 7 to 10  wbc's, rbc's too numerous to count.   ADMISSION DIAGNOSES:  1.  Renal insufficiency.  2.  Dehydration.  3.  Urinary tract infection rule out sepsis.   MEDICATIONS:  He had been taking a lot of Tylenol for sinus pain and Zocor  and Naproxen.   HOSPITAL COURSE:  The patient was placed on intravenous fluids, intravenous  ciprofloxacin.  All his medications were put on hold. Ultrasound of the  kidney, ureter, and bladder were performed.   His condition began to improve slowly with the above regimen. His white  count went up to 16.6. His BUN was up to 27, creatinine up to 4.0.  Ultrasound showed no  hydronephrosis. With gentle hydration, his liver  enzymes began to improve. Urine culture was positive for gram-negative rods  which turned out to be E. coli. His antibiotic therapy was changed to  intravenous Zosyn and hydration was continued.   Consult was requested with Dr. Courtney [name], his urologist, who  evaluated him and felt that his condition was mainly due to prerenal  azotemia, dehydration, and no intervention was required for the distal right  ureteral stone found on a noncontrast CT scan performed.   He began to tolerate oral intake while his antibiotics and intravenous  fluids were continued. His white count went up to 18.4. Then it began to  come down. His liver enzymes also began to improve.   On March 21, 2004, his AST was 74, ALT 150, albumin 2.2, BUN was 29,  creatinine 3.3. White count was 18.4, hemoglobin 10.1, platelet count  177,000. Intravenous Zosyn was continued.   On March 23, 2004, his white count was down to 8.3 and his BUN was down  17, creatinine down to 2.7. He was not tolerating oral intake and  intravenous antibiotics was changed to oral Augmentin. He was making good  urine output and was ambulating in the hallway.   On March 26, 2004, he had no new complaints. He was taking a full diet. His  vital signs were stable and he was well hydrated and afebrile. Abdomen was  soft and nontender. He was therefore consider stable for discharge.   FOLLOW UP:  Follow up was to be with Dr. Dennison Nancy. [name] in one week.   DISCHARGE DIAGNOSES:  1.  Escherichia coli urosepsis.  2.  Acute renal insufficiency.  3.  Benign prostatic hypertrophy.  4.  Alzheimer's dementia/depression.  5.  Nonobstructive nephrolithiasis.  6.  Anemia of chronic disease.   DISCHARGE MEDICATIONS:  1.  Nexium 40 mg daily.  2.  Flomax 20 mg daily.  3.  Zocor 20 mg daily.  4.  Aspirin 81 mg daily.  5.  Risperdal b.i.d.  6.  Lexapro 10mg  qd.   FOLLOW UP:  He was to  follow up with me and also Dr. Dennison Nancy. [name] in  one week.      EA/MEDQ  D:  06/10/2004  T:  06/11/2004  Job:  [id number]

## 2010-06-12 NOTE — H&P (Signed)
NAME:  [name] [name], [name] [name] NO.:  [id number]   MEDICAL RECORD NO.:  [id number]          PATIENT TYPE:  INP   LOCATION:  3738                         FACILITY:  [facility]   PHYSICIAN:  Ricki Rodriguez, M.D.  DATE OF BIRTH:  [birth year]   DATE OF ADMISSION:  10/04/2005  DATE OF DISCHARGE:                                HISTORY & PHYSICAL   CHIEF COMPLAINT:  Exertional dyspnea.   HISTORY OF PRESENT ILLNESS:  This 77 year old black male presented with  exertional dyspnea without significant chest pain.  He had some abnormality  of the EKG and chest pain 4-5 months ago.  His wife is giving [place] information  of the patient not eating and drinking much.   PAST MEDICAL HISTORY:  1. Negative for diabetes.  2. Positive for hypertension.  3. The patient quit smoking 8 years ago.  Denies alcohol use or drug use.  4. Has a history of elevated cholesterol level.  5. Mild obesity.  6. No myocardial infarction.  7. No history of regular exercise.   FAMILY HISTORY:  No family history of premature coronary artery disease.   PAST SURGICAL HISTORY:  1. Lithotripsy, both kidneys, in 2005, with recurrent urinary tract      infections.  2. Cyst removal from neck in 2001.  3. Bilateral cataract surgery in 2003.  4. Liver biopsy in 2000.   MEDICATIONS:  1. Zocor 20 mg one daily.  2. Atacand 60 mg one daily.  3. Mirtazapine 30 mg one daily.  4. Aspirin 81 mg daily.  5. Aricept 10 mg one daily.  6. Namenda 10 mg two twice daily.  7. Ensure one can daily.  8. Prilosec 20 mg daily.  9. Flomax 0.4 mg one in the evening.  10.Vicodin 5/500 mg one up to four times daily.  11.Robaxin 500 mg one twice daily.  12.Naproxen 500 mg one daily.  13.Nasacort AQ 1-2 sprays per nostril daily.  14.Albuterol M.D.I. two puffs four times daily.  15.Lorazepam 1 mg one daily as needed.  16.Bactrim DS one daily.  17.Nitrofurantoin 50 mg one daily.  18.Multivitamin one daily.   ALLERGIES:  No known drug  allergies.   PERSONAL HISTORY:  Married x2; 7 years in the second marriage.  Wife 11  years old.  The patient had 8 children; 7 living.  One died in infancy.  Had  an additional 2 children from out of marriage.   FAMILY HISTORY:  Father and mother both expired.  The patient does not  remember the details.  He did not have any brothers or sisters.   REVIEW OF SYSTEMS:  Denies recent weight gain or weight loss.  Has positive  vision change, wearing glasses.  Positive cataract surgery bilaterally.  Positive hearing loss.  No history of tinnitus, rhinorrhea.  Full upper and  lower dentures.  No history of cough, hemoptysis, asthma.  Positive history  of COPD, chest pain, claudication, hiatal hernia, kidney stones, dementia,  joint pains.  Negative history of pneumonia, palpitations, dizziness, leg  edema, nausea, vomiting, diarrhea, constipation, stomach ulcer, hepatitis,  blood transfusion, hematuria, stroke,  seizure, psychiatric admissions or  skin rash.  Positive history of flu shot 5 months ago and a pneumonia shot  in 2004.   PHYSICAL EXAMINATION:  VITAL SIGNS:  Temperature 96.9, pulse 86,  respirations 20, blood pressure 117/86, oxygen saturation 97% on room air.  Weight approximately 212 pounds.  Height 5 feet, 5 inches.  GENERAL:  The patient is averagely built and nourished.  Alert and oriented  x2.  HEENT:  Normocephalic and atraumatic.  Eyes - brown.  Pupils equal, round  and reactive to light.  Positive arcus senilis.  Ears, nose and throat -  upper and lower dentures.  Mucous membranes pink and moist.  NECK:  No JVD, no carotid bruit.  Full range of motion.  LUNGS:  Clear bilaterally.  HEART:  Normal S1 and S2.  ABDOMEN:  Soft, nontender.  Mildly distended.  EXTREMITIES:  No edema, cyanosis or clubbing.  CNS:  Cranial nerves grossly intact.  Bilateral equal grips.  The patient  moves all 4 extremities.   LABORATORY DATA:  Normal hemoglobin and hematocrit, WBC count,  platelet  count.  Normal INR of 1.2.  Normal electrolytes.  BUN of 42, creatinine 3.7,  albumin 3.4.  Liver enzymes normal.  CK-MB normal.  Cholesterol 160,  triglycerides 284, HDL cholesterol low at 24.   ELECTROCARDIOGRAM:  Sinus rhythm with nonspecific ST-T changes.   DIAGNOSIS:  1. Exertional dyspnea, rule out myocardial infarction.  2. Renal insufficiency.  3. Hypertension.  4. Depression.  5. Hypertrophic cardiomyopathy.   PLAN:  Admit the patient, give IV fluids, change cardiac catheterization to  a nuclear stress test, and continue home medications.      Ricki Rodriguez, M.D.  Electronically Signed     ASK/MEDQ  D:  10/04/2005  T:  10/05/2005  Job:  [id number]

## 2010-06-12 NOTE — Discharge Summary (Signed)
[name]  [name], [name] NO.:  [id number]   MEDICAL RECORD NO.:  [id number]          PATIENT TYPE:  INP   LOCATION:  3034                         FACILITY:  [facility]   PHYSICIAN:  Fleet Contras, M.D.    DATE OF BIRTH:  [birth year]   DATE OF ADMISSION:  08/27/2003  DATE OF DISCHARGE:  09/03/2003                                 DISCHARGE SUMMARY   ADMISSION DIAGNOSES:  1.  Urosepsis.  2.  Obstructive uropathy.  3.  Renal insufficiency.  4.  Dehydration.   HISTORY OF PRESENT ILLNESS:  [name] is an 77 year old African-American  gentleman with past medical history significant for benign prostatic  hypertrophy with elevated PSA who presented to the emergency room at [facility] with complaints of left flank pain associated with hematuria  and high fevers.  He was catheterized in the emergency room with very little  urine output, and this was bloody.  He was thought to have developed  obstructive uropathy associated with urinary tract infection, possibly  pyelonephritis.  He had temperature up to 105 degrees F, and admission  laboratory data  included creatinine of 2.3.   HOSPITAL COURSE:  On admission, he was started on IV fluids D5 normal saline  at 100 ml an hour, intravenous Zosyn 2.25 g q.6h., and ultrasound scan of  the kidneys, ureter, and bladder with ___________.  His initial white count  was 21.7, hemoglobin 11.8.  An urgent urology consult was requested, and the  patient was seen by Dr. Aldean Ast.  He was scheduled for a cystoscopy to  take place soon as possible. Ultrasound showed bilateral renal calculi with  right-sided mild hydronephrosis.   On August 29, 2003, [name] underwent a cystoscopy with attempted right  ureter catheterization performed by Dr. Aldean Ast.  His condition continued  to improve.  His urine output improved, and his white count began to  decrease.  Fever resolved.  Urine culture was positive for gram-negative  rods  sensitive to Zosyn.  Blood culture was also positive for what was  identified as E. coli also sensitive to Zosyn.   On September 03, 2003, he was feeling much better, eating a full diet,  ambulating.  He still had some urinary urgency.  He had been started on  Flomax by the urologist.  He was making an adequate amount of urine. Was  changed to oral cefuroxime b.i.d. which he was tolerating.   DISCHARGE DIAGNOSES:  1.  Escherichia coli sepsis.  2.  Pyelonephritis.  3.  Obstructive uropathy.  4.  Bilateral renal calculi.  5.  Renal insufficiency.  6.  Benign prostatic hypertrophy with elevated PSA.  7.  Stable dementia.  8.  Dehydration.   DISCHARGE MEDICATIONS:  1.  Flomax 0.4 mg 1 p.o. daily.  2.  Cefuroxime 500 mg p.o. b.i.d. __________.  3.  Lexapro 10 mg daily.  4.  Aricept 5 mg daily.  5.  Namenda 10 mg b.i.d.  6.  Aspirin 81 mg daily.  7.  Nexium 40 mg daily.  8.  Zocor 20 mg daily.   FOLLOW UP:  He was to follow up with Dr. Fleet Contras in two weeks, Dr.  Aldean Ast on two to four weeks.       EA/MEDQ  D:  11/24/2003  T:  11/25/2003  Job:  [id number]

## 2010-06-12 NOTE — Consult Note (Signed)
NAME:  [name] [name], DEMELO NO.:  [id number]   MEDICAL RECORD NO.:  [id number]          PATIENT TYPE:  INP   LOCATION:  5009                         FACILITY:  [facility]   PHYSICIAN:  Rozanna Boer., M.D.DATE OF BIRTH:  February 11, 1919   DATE OF CONSULTATION:  03/20/2004  DATE OF DISCHARGE:                                   CONSULTATION   REASON FOR CONSULTATION:  Azotemia, history of stones, dehydration, and  pain.   HISTORY OF PRESENT ILLNESS:  This 77 year old demented black male with  Alzheimer's disease was admitted yesterday with dehydration, chills, fever,  and some nausea and vomiting. Initially, his renal ultrasound showed no  hydronephrosis. However, CT scan today without contrast showed some mild  right hydronephrosis with a 3.8 mm stone in his right distal ureter and a 6  mm stone, either at the posterior bladder base on the right or possibly at  the UV junction. He had a little tiny stone on the left. His laboratory  values showed a white count of 18,700 today with hematocrit of 32% after  hydration with 4 liters of fluid. Creatinine was 4.0 yesterday and was down  to 3.4 today. I saw the patient last in the office on February 14, 2004. I  first met Mr. Quale when he had fever and GU sepsis, when he presented in  February 2005 with a large left stone. This was treated with lithotripsy  after he recovered from pyelonephritis, which was successful in February of  last year. He then had a 10 mm stone on the right side that was treated with  lithotripsy November 06, 2004 and I have seen him a couple of times in the  office since then. He has passed the fragments progressively. When I saw him  on February 14, 2004, he just had a little 2 to 3 mm stone in the lower pole  of his right kidney. I did not see any on the left. This was on a KUB. His  wife was going to continue postural drainage until the stone passed and I  was going to see him in 3 months.   MEDICATIONS:  Include Remeron, Namenda, Flomax, Aricept, Zocor, and  Prevacid.   ALLERGIES:  None.   FAMILY HISTORY/SOCIAL HISTORY/REVIEW OF SYSTEMS:  Please see the previous  note from Dr. Concepcion Elk. He does not drink alcohol and does not smoke. He  lives with his wife, who is his primary caregiver.   PHYSICAL EXAMINATION:  VITAL SIGNS:  Temperature 98.7, blood pressure  128/80, pulse 76.  GENERAL:  He is an elderly, somewhat confused black male. Complained of no  pain today. Lying quietly in bed in no acute distress. He has been afebrile  since he has been here. He does seem to be apparently a little bit more  alert than the nurses say he was earlier. He has a Foley catheter in place  draining clear urine. His output has increased since he received about 4  liters of fluid as resuscitation per Dr. Concepcion Elk. He is a balding, pleasant,  black male in no acute distress.  HEENT:  Negative. Oropharynx is benign.  CHEST:  Clear.  ABDOMEN:  Soft. No CVA tenderness. No masses. Liver and spleen not palpable.  GENITOURINARY:  Bladder is non-distended. Penis normal, uncircumcised. Foley  catheter in place. Bilaterally distended normal size testes. Epididymis non-  tender. Prostate is small and benign.  EXTREMITIES:  Negative. No edema. Good distal pulses. Good sensation to  light touch.   IMPRESSION:  1.  Dehydration.  2.  Pre-renal azotemia.  3.  Distal 3 mm right ureteral stone with mild obstruction.  4.  Dementia and Alzheimer's disease.  5.  Previous bilateral lithotripsy, left side May of 2005 and the right      side, October 2005.   RECOMMENDATIONS:  Continue to hydrate with IV's. Watch the creatinine.  Hopefully, it will come down. Repeat CT without contrast if not improved by  early next week. Dr. Vonita Moss is on call this weekend and will see the  patient over the weekend for me. Hopefully, he will pass the stone that he  has now.      HMK/MEDQ  D:  03/20/2004  T:  03/22/2004   Job:  [id number]

## 2010-06-12 NOTE — Op Note (Signed)
NAME:  [name] [name], [name] [name] NO.:  [id number]   MEDICAL RECORD NO.:  [id number]                   PATIENT TYPE:  AMB   LOCATION:  DAY                                  [facility]ACILITY:  [facility]   PHYSICIAN:  Rozanna Boer., M.D.      DATE OF BIRTH:  11/14/1917   DATE OF PROCEDURE:  08/29/2003  DATE OF DISCHARGE:                                 OPERATIVE REPORT   PREOPERATIVE DIAGNOSES:  Right hydronephrosis, GU sepsis, bilateral renal  calculi, distal right ureteral stone, pyelonephritis.   POSTOPERATIVE DIAGNOSES:  Right hydronephrosis, GU sepsis, bilateral renal  calculi, distal right ureteral stone, pyelonephritis.   OPERATION:  Cysto attempted right ureteral catheterization but unsuccessful.   ANESTHESIA:  General.   SURGEON:  Courtney Paris, M.D.   BRIEF HISTORY:  This 77 year old patient presented two nights ago with GU  sepsis, 105 fever, had Gram negative rods in his urine and in urinary  retention. Ultrasound showed mild hydronephrosis on the right with bilateral  renal calculi. He has a history of a previous lithotripsy at [facility]  about 4 or 5 years ago. The patient has dementia, Alzheimer's and is  somewhat combative.  He enters now for Cysto, retrograde and attempted  placement of a stent and/or possible ureteroscopy if necessary.   The patient was placed on the operating table in the dorsal lithotomy  position.  After satisfactory induction of general anesthesia was prepped  and draped with Betadine in the usual sterile fashion.  He had a loose bowel  movement and the drapes to be then changed.  He was then reprepped and  draped and a #21 panendoscope was then passed under direct vision, no  anterior stricture seen, the posterior urethra was quite long and about 4-5  cm in length. It was trilobar in configuration and very edematous.  His  bladder was entered, it was quite beefy red from the indwelling Foley  catheter he had had  plus the edema and hyperemia and erythema and marked  bladder trabeculation, I was not able to find either orifice.  I probed  several things with the 0.038 floppy tip guidewire but could not find the  orifice itself.  After about 45 minutes, I quit and replaced the #18 Foley  catheter, irrigated the bladder which was still light pink at the time.  He  was then taken to the recovery room in good condition to be later discharged  as an outpatient back to [facility] where he is admitted.   PLAN:  Send back to [facility] by [facility].  His lungs are quite wet and will need  some Lasix to dry them out. Would do a right percutaneous nephrostomy by  radiology if he continues to have right hydronephrosis and infection, etc.  Would continue to treat his infection with Zosyn or appropriate antibiotics  once cultures are back. Would start Flomax with voiding trial 0.4 mg a day  and will  see him back at a later time.                                               Rozanna Boer., M.D.    HMK/MEDQ  D:  08/29/2003  T:  08/30/2003  Job:  [id number]   cc:   Fleet Contras, M.D.  [address]  [place]  [place] [address]  Fax: [phone]

## 2010-08-12 ENCOUNTER — Encounter: Payer: Self-pay | Admitting: Podiatry

## 2010-08-12 DIAGNOSIS — Z87438 Personal history of other diseases of male genital organs: Secondary | ICD-10-CM

## 2010-08-12 DIAGNOSIS — R0989 Other specified symptoms and signs involving the circulatory and respiratory systems: Secondary | ICD-10-CM

## 2010-08-12 DIAGNOSIS — M1990 Unspecified osteoarthritis, unspecified site: Secondary | ICD-10-CM

## 2010-08-12 DIAGNOSIS — G309 Alzheimer's disease, unspecified: Secondary | ICD-10-CM

## 2010-08-12 DIAGNOSIS — F329 Major depressive disorder, single episode, unspecified: Secondary | ICD-10-CM | POA: Insufficient documentation

## 2010-11-05 LAB — DIFFERENTIAL
Basophils Relative: 1
Lymphocytes Relative: 26
Lymphs Abs: 1.5
Monocytes Relative: 10
Neutro Abs: 3.2
Neutrophils Relative %: 57

## 2010-11-05 LAB — CARDIAC PANEL(CRET KIN+CKTOT+MB+TROPI)
CK, MB: 2.1
Total CK: 234 — ABNORMAL HIGH
Troponin I: 0.05

## 2010-11-05 LAB — I-STAT 8, (EC8 V) (CONVERTED LAB)
Chloride: 110
HCT: 43
Hemoglobin: 14.6
Operator id: 285841
pCO2, Ven: 38 — ABNORMAL LOW

## 2010-11-05 LAB — POCT CARDIAC MARKERS
CKMB, poc: <1 — ABNORMAL LOW
Myoglobin, poc: 125
Myoglobin, poc: 126

## 2010-11-05 LAB — CBC
HCT: 40
Hemoglobin: 13.4
MCHC: 33.5
MCHC: 33.6
MCV: 97.5
MCV: 98
Platelets: 196
RBC: 4.08 — ABNORMAL LOW
RDW: 13.7
WBC: 5.7

## 2010-11-05 LAB — CK TOTAL AND CKMB (NOT AT ARMC)
CK, MB: 1.8
Total CK: 121

## 2010-11-05 LAB — PROTIME-INR: Prothrombin Time: 14.2

## 2010-11-05 LAB — HEPARIN LEVEL (UNFRACTIONATED): Heparin Unfractionated: 0.26 — ABNORMAL LOW

## 2010-11-05 LAB — POCT I-STAT CREATININE: Creatinine, Ser: 2.1 — ABNORMAL HIGH

## 2010-11-17 IMAGING — CR DG CHEST 2V
2 series · 2 of 2 positions shown · non-contrast
Comparison: Portable chest x-ray 10/26/2006.  Two-view chest x-ray
06/17/2006 and 09/25/2005.

CLINICAL DATA: Chest pain.  Left upper extremity numbness.
Headaches and dizziness.

CHEST - 2 VIEW 10/22/2008:

[w chest lat]
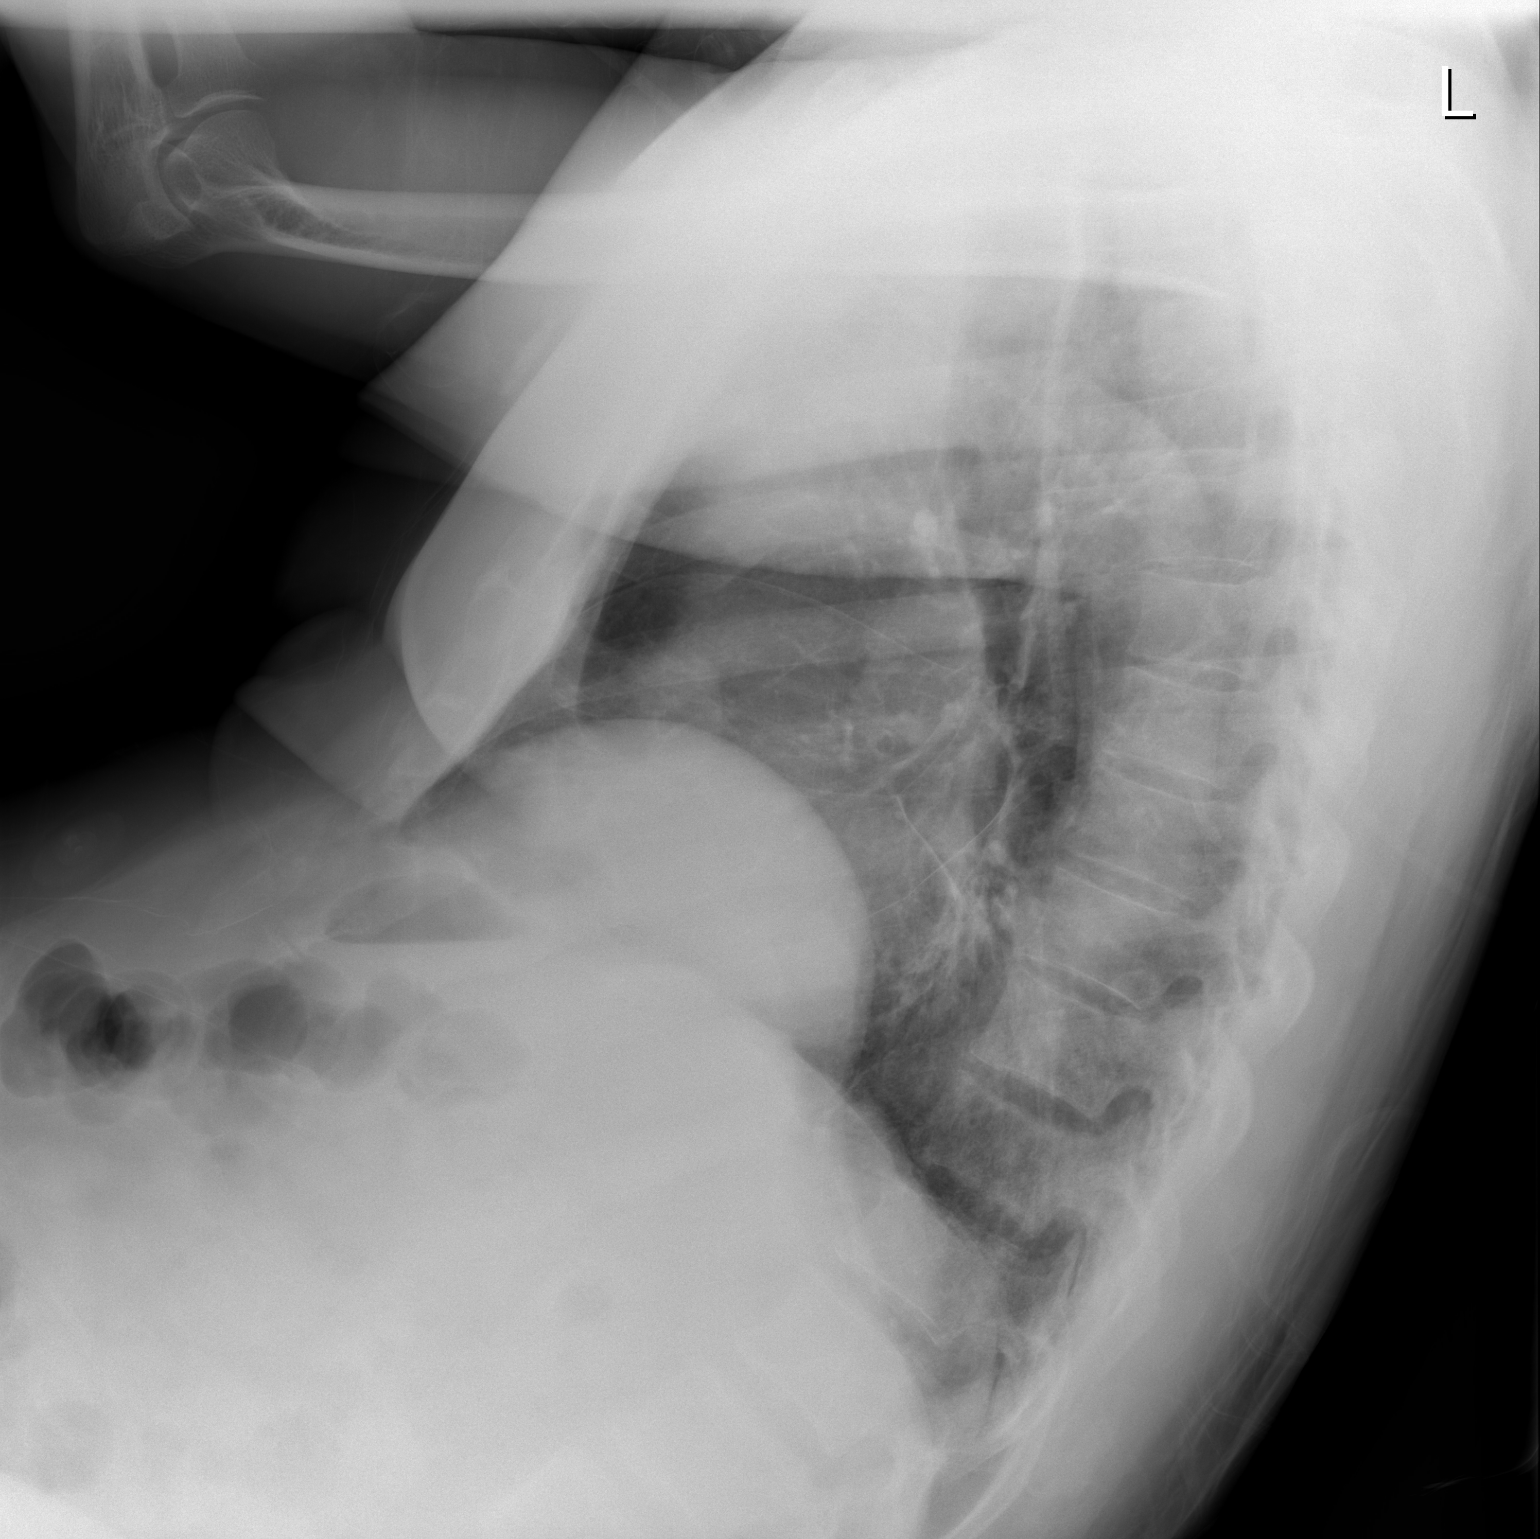

[view not recorded]
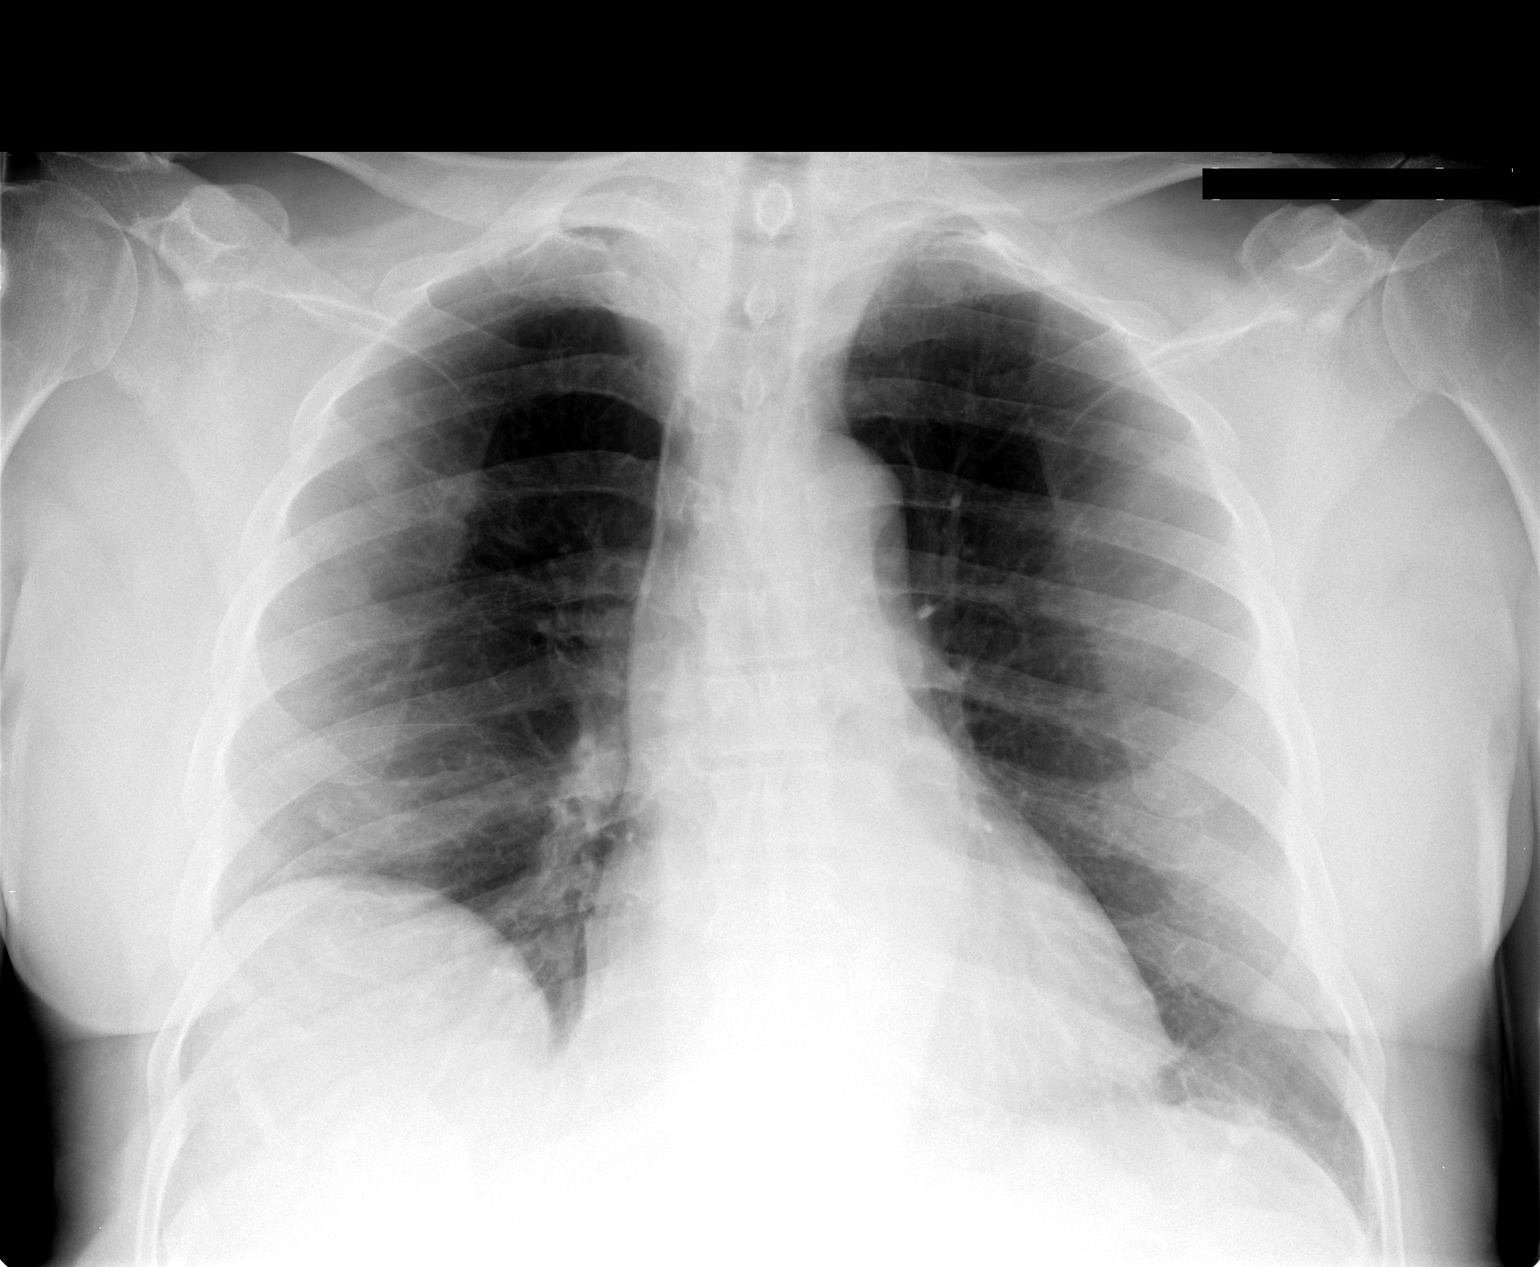

[2 of 2 positions shown; findings below may reference images not displayed]

FINDINGS: Heart size upper normal and stable.  Hilar and
mediastinal contours unremarkable.  Eventration of the right
anterior hemidiaphragm, unchanged.  Scarring in the left lower
lobe, unchanged.  Nodular opacity projected over the right upper
lobe on the frontal image, not localized on the lateral image; this
may represent a confluence of bronchovascular markings overlying
the scapula and the posterior fifth rib.
IMPRESSION: No acute cardiopulmonary disease.  Scarring in the left lower lobe.
Apparent nodular opacity overlying the right upper lobe most likely
confluent bronchovascular markings overlying osseous structures.
Short-term interval follow-up in 2-3 months may be confirmatory.

## 2010-11-17 IMAGING — CT CT ABDOMEN W/O CM
1 series · 15 of 32 positions shown, 19 images · non-contrast
Comparison: Prior examinations 11/25/2004.

CT ABDOMEN

CLINICAL DATA: Low abdominal and bilateral flank pain.  Hematuria.
Question ureteral calculus.

CT ABDOMEN AND PELVIS WITHOUT CONTRAST
TECHNIQUE: Multidetector CT imaging of the abdomen and pelvis was
performed following the standard protocol without intravenous
contrast.

[Series 4: lung windows · axial · 0.74mm/px · z∈[-437,+8]mm · 15 of 100 slices shown, 19 images]
[im 7/100  soft-tissue]
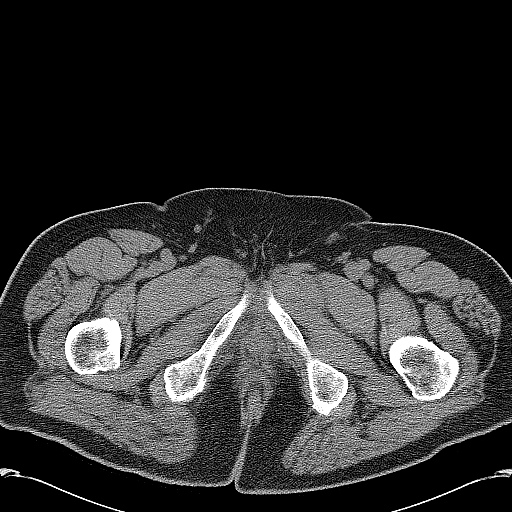
[im 7/100  bone]
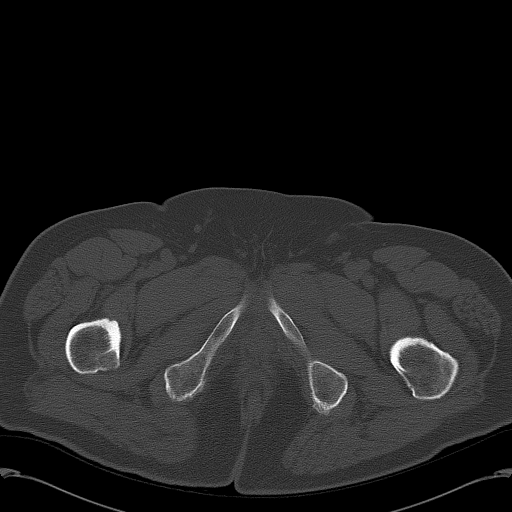
[im 13/100  soft-tissue]
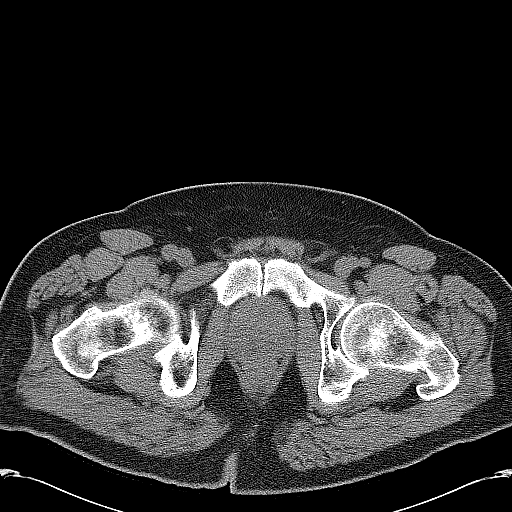
[im 20/100  soft-tissue]
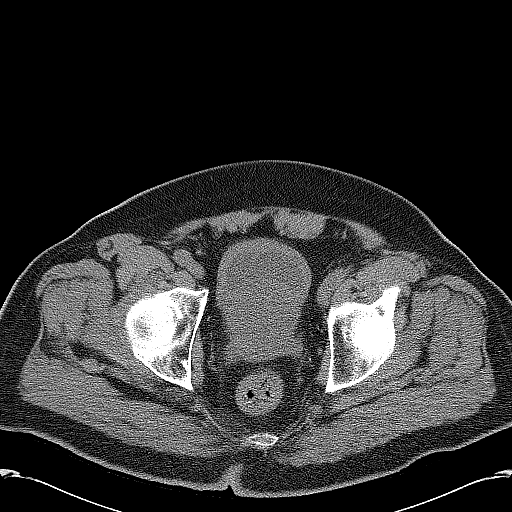
[im 29/100  soft-tissue]
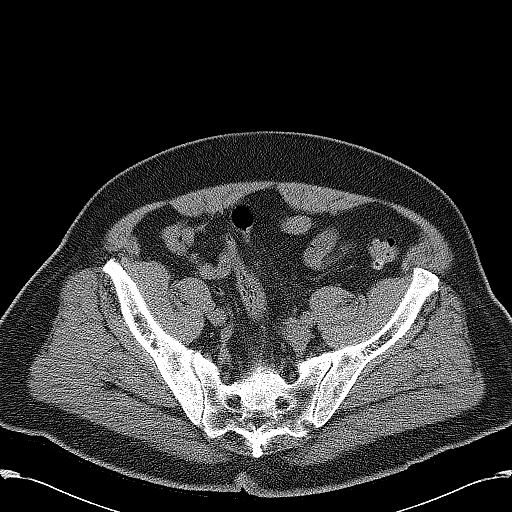
[im 36/100  soft-tissue]
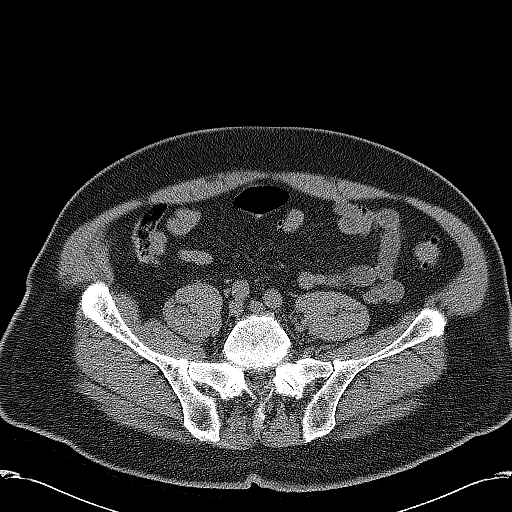
[im 42/100  soft-tissue]
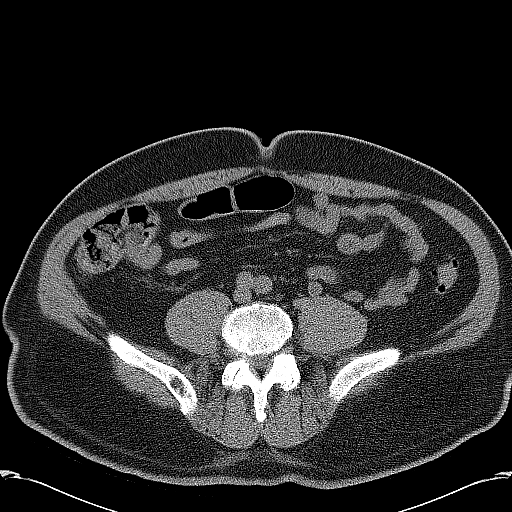
[im 52/100  soft-tissue]
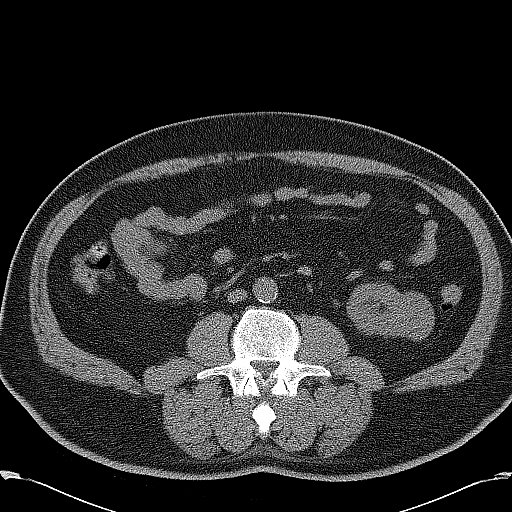
[im 58/100  soft-tissue]
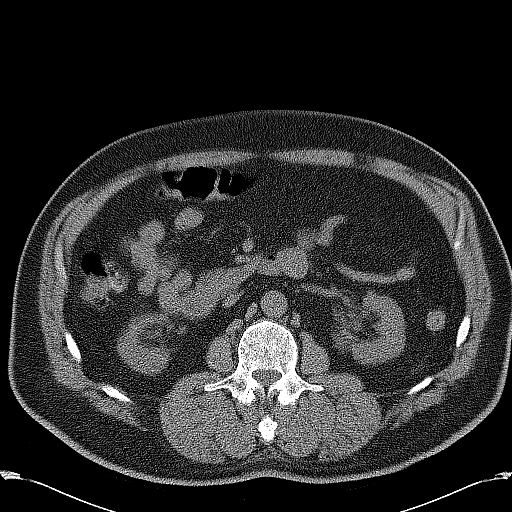
[im 64/100  soft-tissue]
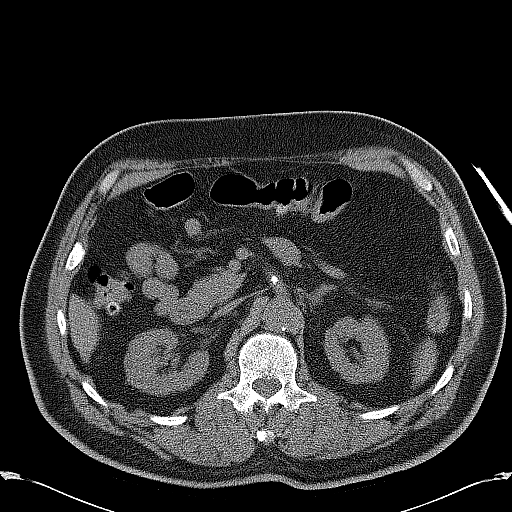
[im 64/100  bone]
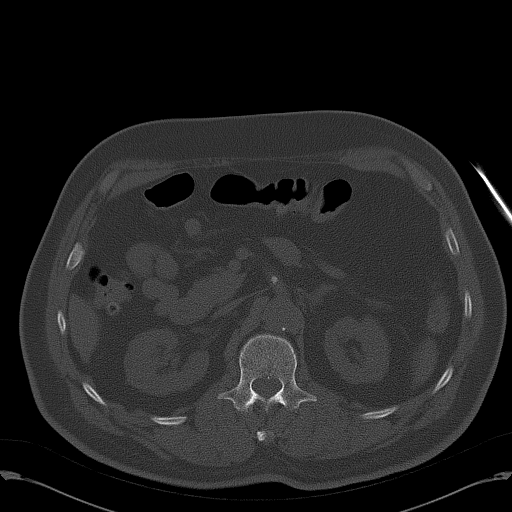
[im 71/100  soft-tissue]
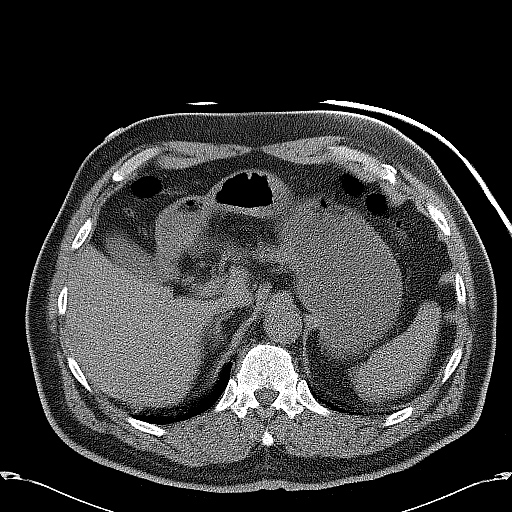
[im 80/100  soft-tissue]
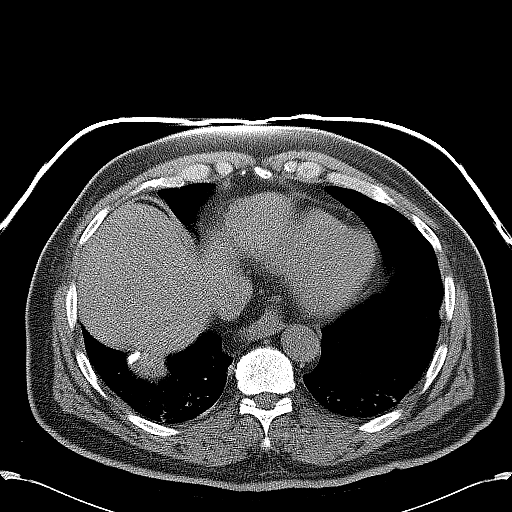
[im 87/100  soft-tissue]
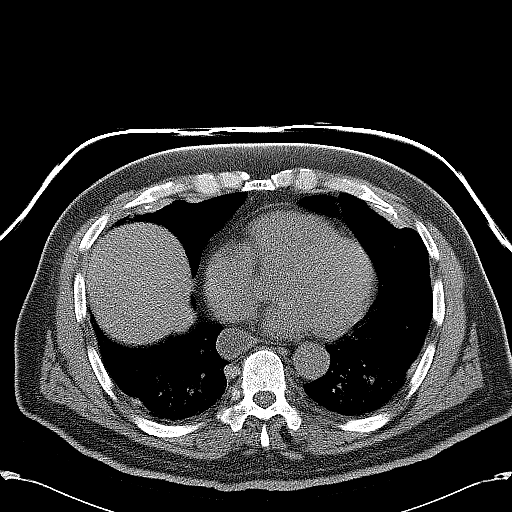
[im 87/100  lung]
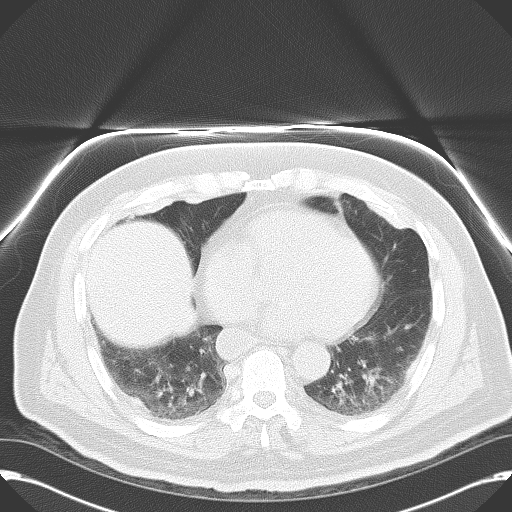
[im 90/100  lung]
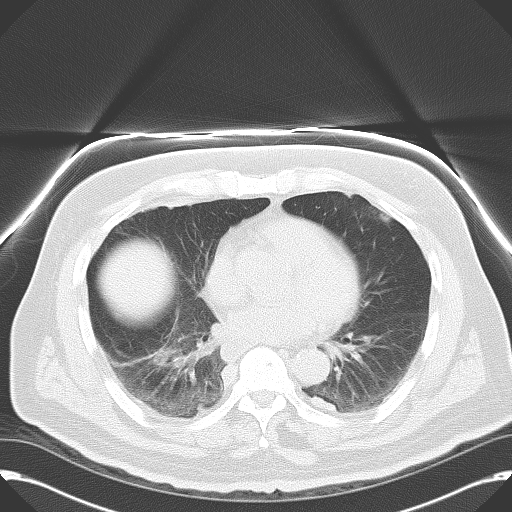
[im 93/100  soft-tissue]
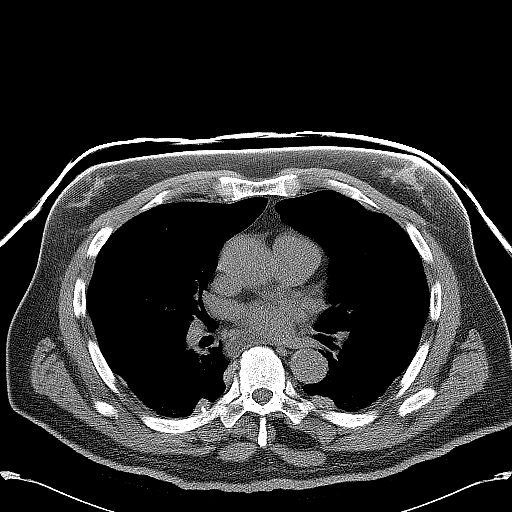
[im 93/100  lung]
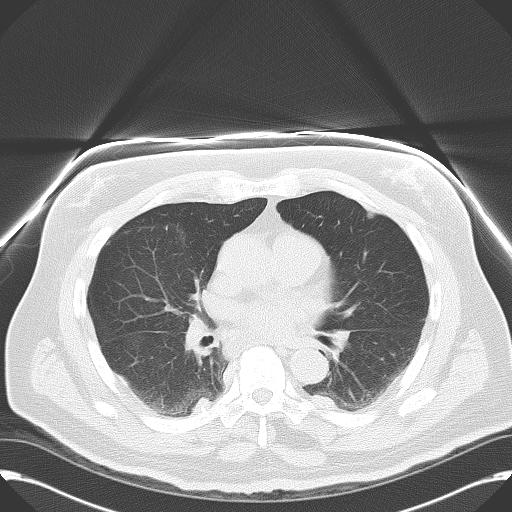
[im 96/100  lung]
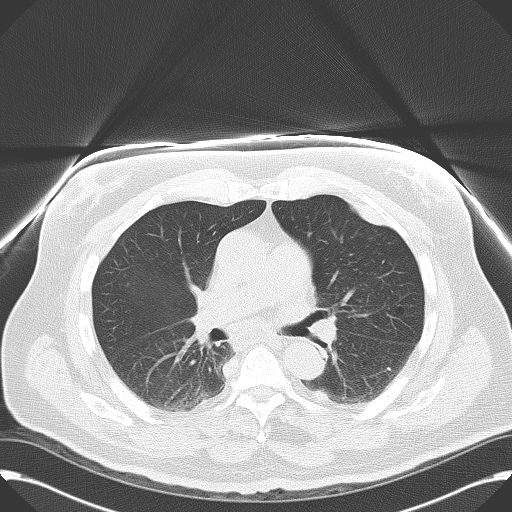

[15 of 32 positions shown; findings below may reference images not displayed]

FINDINGS: Extensive calcified pleural plaque formation in both
lung bases appears grossly stable compared with the prior study.
The distal esophagus is mildly fluid-filled.

No renal calculi are demonstrated.  There is no hydronephrosis or
perinephric soft tissue stranding.  In the upper pole of the right
kidney is an enlarging low density 1.5 cm lesion on image 32,
probably a cyst.  In the lower pole of the left kidney, there is a
new 3.7 x 3.2 cm soft tissue mass which is concerning for renal
cell carcinoma.

There is no retroperitoneal lymphadenopathy.  As imaged in the
noncontrast state, the liver, spleen, adrenal glands, pancreas and
gallbladder appear normal.  Lower lumbar spine degenerative changes
are present.
IMPRESSION: 1.  No evidence of urinary tract calculus or hydronephrosis.
2.  3.7 cm probable soft tissue mass emanating from the lower pole
of the left kidney.  This is suspicious for renal cell carcinoma in
the setting of hematuria, although could reflect a complex cyst.
Further evaluation with dedicated renal CT or MRI (with
postcontrast images) is recommended.

CT PELVIS
FINDINGS: Distally, the ureters are normal in caliber.  There is a
new 5 mm right pelvic calcification on image 77 which appears to be
in the distal right ureter, just proximal to the ureteral vesicle
junction.  There is no evidence of left ureteral or bladder
calculus.  The prostate gland is markedly enlarged, measuring up to
6.2 cm in diameter.
IMPRESSION: 1.  New 5 mm right pelvic calcification appears to be in the distal
right ureter but is not associated with ureteral dilatation or
other secondary signs of ureteral obstruction.
2.  Enlarged prostate gland.

3.  Left renal mass - see abdominal findings above.

## 2012-10-01 ENCOUNTER — Observation Stay (HOSPITAL_COMMUNITY)
Admission: EM | Admit: 2012-10-01 | Discharge: 2012-10-02 | Disposition: A | Discharge status: Home / Self Care | Payer: PRIVATE HEALTH INSURANCE | Attending: Family Medicine | Admitting: Family Medicine

## 2012-10-01 ENCOUNTER — Encounter (HOSPITAL_COMMUNITY)
Admission: EM | Disposition: A | Discharge status: Home / Self Care | Payer: Self-pay | Source: Home / Self Care | Attending: Emergency Medicine

## 2012-10-01 ENCOUNTER — Emergency Department (HOSPITAL_COMMUNITY): Payer: PRIVATE HEALTH INSURANCE

## 2012-10-01 ENCOUNTER — Inpatient Hospital Stay (HOSPITAL_COMMUNITY): Payer: PRIVATE HEALTH INSURANCE

## 2012-10-01 ENCOUNTER — Ambulatory Visit (HOSPITAL_COMMUNITY): Admit: 2012-10-01 | Payer: Self-pay | Admitting: Cardiology

## 2012-10-01 ENCOUNTER — Encounter (HOSPITAL_COMMUNITY): Payer: Self-pay | Admitting: *Deleted

## 2012-10-01 DIAGNOSIS — Z7902 Long term (current) use of antithrombotics/antiplatelets: Secondary | ICD-10-CM | POA: Insufficient documentation

## 2012-10-01 DIAGNOSIS — F0390 Unspecified dementia without behavioral disturbance: Secondary | ICD-10-CM

## 2012-10-01 DIAGNOSIS — R531 Weakness: Secondary | ICD-10-CM

## 2012-10-01 DIAGNOSIS — Z79899 Other long term (current) drug therapy: Secondary | ICD-10-CM | POA: Insufficient documentation

## 2012-10-01 DIAGNOSIS — R1310 Dysphagia, unspecified: Secondary | ICD-10-CM | POA: Insufficient documentation

## 2012-10-01 DIAGNOSIS — G9340 Encephalopathy, unspecified: Secondary | ICD-10-CM

## 2012-10-01 DIAGNOSIS — N189 Chronic kidney disease, unspecified: Secondary | ICD-10-CM

## 2012-10-01 DIAGNOSIS — I129 Hypertensive chronic kidney disease with stage 1 through stage 4 chronic kidney disease, or unspecified chronic kidney disease: Secondary | ICD-10-CM | POA: Insufficient documentation

## 2012-10-01 DIAGNOSIS — R4789 Other speech disturbances: Secondary | ICD-10-CM | POA: Insufficient documentation

## 2012-10-01 DIAGNOSIS — N179 Acute kidney failure, unspecified: Secondary | ICD-10-CM

## 2012-10-01 DIAGNOSIS — R5381 Other malaise: Secondary | ICD-10-CM | POA: Insufficient documentation

## 2012-10-01 DIAGNOSIS — F0280 Dementia in other diseases classified elsewhere without behavioral disturbance: Secondary | ICD-10-CM | POA: Diagnosis present

## 2012-10-01 DIAGNOSIS — I639 Cerebral infarction, unspecified: Secondary | ICD-10-CM

## 2012-10-01 DIAGNOSIS — N184 Chronic kidney disease, stage 4 (severe): Secondary | ICD-10-CM | POA: Insufficient documentation

## 2012-10-01 DIAGNOSIS — G459 Transient cerebral ischemic attack, unspecified: Principal | ICD-10-CM

## 2012-10-01 DIAGNOSIS — G309 Alzheimer's disease, unspecified: Secondary | ICD-10-CM | POA: Insufficient documentation

## 2012-10-01 HISTORY — DX: Major depressive disorder, single episode, unspecified: F32.9

## 2012-10-01 HISTORY — DX: Unspecified asthma, uncomplicated: J45.909

## 2012-10-01 HISTORY — DX: Dementia in other diseases classified elsewhere, unspecified severity, without behavioral disturbance, psychotic disturbance, mood disturbance, and anxiety: F02.80

## 2012-10-01 HISTORY — DX: Unspecified dementia, unspecified severity, without behavioral disturbance, psychotic disturbance, mood disturbance, and anxiety: F03.90

## 2012-10-01 HISTORY — DX: Nasal congestion: R09.81

## 2012-10-01 HISTORY — DX: Pure hypercholesterolemia, unspecified: E78.00

## 2012-10-01 HISTORY — DX: Unspecified osteoarthritis, unspecified site: M19.90

## 2012-10-01 HISTORY — DX: Alzheimer's disease, unspecified: G30.9

## 2012-10-01 HISTORY — DX: Depression, unspecified: F32.A

## 2012-10-01 HISTORY — DX: Disorder of kidney and ureter, unspecified: N28.9

## 2012-10-01 HISTORY — DX: Essential (primary) hypertension: I10

## 2012-10-01 LAB — DIFFERENTIAL
Basophils Absolute: 0 10*3/uL (ref 0.0–0.1)
Eosinophils Absolute: 0.2 10*3/uL (ref 0.0–0.7)
Eosinophils Relative: 2 % (ref 0–5)
Lymphs Abs: 1.2 10*3/uL (ref 0.7–4.0)
Neutrophils Relative %: 71 % (ref 43–77)

## 2012-10-01 LAB — COMPREHENSIVE METABOLIC PANEL
ALT: 21 U/L (ref 0–53)
AST: 23 U/L (ref 0–37)
Albumin: 3.4 g/dL — ABNORMAL LOW (ref 3.5–5.2)
Alkaline Phosphatase: 107 U/L (ref 39–117)
Calcium: 9.6 mg/dL (ref 8.4–10.5)
GFR calc Af Amer: 22 mL/min — ABNORMAL LOW (ref >90)
Glucose, Bld: 105 mg/dL — ABNORMAL HIGH (ref 70–99)
Potassium: 4.3 mEq/L (ref 3.5–5.1)
Sodium: 140 mEq/L (ref 135–145)
Total Protein: 7.3 g/dL (ref 6.0–8.3)

## 2012-10-01 LAB — PROTIME-INR
INR: 1.15 (ref 0.00–1.49)
Prothrombin Time: 14.5 seconds (ref 11.6–15.2)

## 2012-10-01 LAB — POCT I-STAT, CHEM 8
BUN: 30 mg/dL — ABNORMAL HIGH (ref 6–23)
Creatinine, Ser: 3.1 mg/dL — ABNORMAL HIGH (ref 0.50–1.35)
Glucose, Bld: 105 mg/dL — ABNORMAL HIGH (ref 70–99)
Sodium: 145 mEq/L (ref 135–145)
TCO2: 23 mmol/L (ref 0–100)

## 2012-10-01 LAB — CBC
HCT: 41.8 % (ref 39.0–52.0)
Hemoglobin: 14 g/dL (ref 13.0–17.0)
MCH: 33.7 pg (ref 26.0–34.0)
MCH: 32.3 pg (ref 26.0–34.0)
MCHC: 33.5 g/dL (ref 30.0–36.0)
MCV: 95.7 fL (ref 78.0–100.0)
Platelets: 200 10*3/uL (ref 150–400)
RBC: 4.19 MIL/uL — ABNORMAL LOW (ref 4.22–5.81)
RDW: 14.9 % (ref 11.5–15.5)
WBC: 6.6 10*3/uL (ref 4.0–10.5)

## 2012-10-01 LAB — URINALYSIS, ROUTINE W REFLEX MICROSCOPIC
Bilirubin Urine: NEGATIVE
Ketones, ur: NEGATIVE mg/dL
Nitrite: NEGATIVE
Protein, ur: NEGATIVE mg/dL
pH: 6 (ref 5.0–8.0)

## 2012-10-01 LAB — RAPID URINE DRUG SCREEN, HOSP PERFORMED
Amphetamines: NOT DETECTED
Barbiturates: NOT DETECTED
Benzodiazepines: NOT DETECTED
Cocaine: NOT DETECTED
Tetrahydrocannabinol: NOT DETECTED

## 2012-10-01 LAB — POCT I-STAT TROPONIN I

## 2012-10-01 LAB — ETHANOL: Alcohol, Ethyl (B): <11 mg/dL (ref 0–11)

## 2012-10-01 SURGERY — LEFT HEART CATH
Anesthesia: LOCAL

## 2012-10-01 MED ORDER — OMEGA-3 FATTY ACIDS 1000 MG PO CAPS: 1.0000 g | ORAL_CAPSULE | Freq: Every day | ORAL | Status: DC

## 2012-10-01 MED ORDER — HEPARIN SODIUM (PORCINE) 5000 UNIT/ML IJ SOLN
5000.0000 [IU] | Freq: Three times a day (TID) | INTRAMUSCULAR | Status: DC
  Administered 2012-10-01 – 2012-10-02 (×2): 5000 [IU] via SUBCUTANEOUS
  Filled 2012-10-01 (×6): qty 1

## 2012-10-01 MED ORDER — NORTRIPTYLINE HCL 25 MG PO CAPS
75.0000 mg | ORAL_CAPSULE | Freq: Every day | ORAL | Status: DC
  Filled 2012-10-01 (×3): qty 3

## 2012-10-01 MED ORDER — SODIUM CHLORIDE 0.9 % IV SOLN
INTRAVENOUS | Status: DC
  Administered 2012-10-01: 17:00:00 via INTRAVENOUS

## 2012-10-01 MED ORDER — ALBUTEROL SULFATE HFA 108 (90 BASE) MCG/ACT IN AERS
2.0000 | INHALATION_SPRAY | Freq: Four times a day (QID) | RESPIRATORY_TRACT | Status: DC | PRN
  Filled 2012-10-01: qty 6.7

## 2012-10-01 MED ORDER — AMIODARONE HCL 100 MG PO TABS
100.0000 mg | ORAL_TABLET | Freq: Every day | ORAL | Status: DC
  Administered 2012-10-02: 100 mg via ORAL
  Filled 2012-10-01: qty 1

## 2012-10-01 MED ORDER — CARBOXYMETHYLCELLULOSE SODIUM 0.5 % OP SOLN: 1.0000 [drp] | Freq: Three times a day (TID) | OPHTHALMIC | Status: DC | PRN

## 2012-10-01 MED ORDER — FINASTERIDE 5 MG PO TABS
5.0000 mg | ORAL_TABLET | Freq: Every day | ORAL | Status: DC
  Administered 2012-10-02: 5 mg via ORAL
  Filled 2012-10-01: qty 1

## 2012-10-01 MED ORDER — LEVOTHYROXINE SODIUM 75 MCG PO TABS
75.0000 ug | ORAL_TABLET | Freq: Every day | ORAL | Status: DC
  Filled 2012-10-01 (×2): qty 1

## 2012-10-01 MED ORDER — LOTEPREDNOL-TOBRAMYCIN 0.5-0.3 % OP SUSP: 1.0000 [drp] | Freq: Four times a day (QID) | OPHTHALMIC | Status: DC

## 2012-10-01 MED ORDER — MEMANTINE HCL 10 MG PO TABS
10.0000 mg | ORAL_TABLET | Freq: Two times a day (BID) | ORAL | Status: DC
  Administered 2012-10-02: 10 mg via ORAL
  Filled 2012-10-01 (×4): qty 1

## 2012-10-01 MED ORDER — FLUTICASONE PROPIONATE 50 MCG/ACT NA SUSP
2.0000 | Freq: Every day | NASAL | Status: DC | PRN
  Filled 2012-10-01: qty 16

## 2012-10-01 MED ORDER — ASPIRIN EC 81 MG PO TBEC
81.0000 mg | DELAYED_RELEASE_TABLET | Freq: Every day | ORAL | Status: DC
  Administered 2012-10-02: 81 mg via ORAL
  Filled 2012-10-01: qty 1

## 2012-10-01 MED ORDER — POLYVINYL ALCOHOL 1.4 % OP SOLN
1.0000 [drp] | Freq: Three times a day (TID) | OPHTHALMIC | Status: DC | PRN
  Administered 2012-10-02: 1 [drp] via OPHTHALMIC
  Filled 2012-10-01 (×2): qty 15

## 2012-10-01 MED ORDER — CLOPIDOGREL BISULFATE 75 MG PO TABS
75.0000 mg | ORAL_TABLET | Freq: Every day | ORAL | Status: DC
  Administered 2012-10-02: 75 mg via ORAL
  Filled 2012-10-01: qty 1

## 2012-10-01 MED ORDER — OMEGA-3-ACID ETHYL ESTERS 1 G PO CAPS
1.0000 g | ORAL_CAPSULE | Freq: Every day | ORAL | Status: DC
  Filled 2012-10-01 (×2): qty 1

## 2012-10-01 MED ORDER — HYDROCODONE-ACETAMINOPHEN 5-325 MG PO TABS: 1.0000 | ORAL_TABLET | Freq: Four times a day (QID) | ORAL | Status: DC | PRN

## 2012-10-01 MED ORDER — METOPROLOL SUCCINATE 12.5 MG HALF TABLET
12.5000 mg | ORAL_TABLET | Freq: Every evening | ORAL | Status: DC
  Filled 2012-10-01 (×2): qty 1

## 2012-10-01 MED ORDER — ISOSORBIDE MONONITRATE ER 30 MG PO TB24
30.0000 mg | ORAL_TABLET | Freq: Every day | ORAL | Status: DC
  Administered 2012-10-02: 30 mg via ORAL
  Filled 2012-10-01: qty 1

## 2012-10-01 MED ORDER — NITROGLYCERIN 0.4 MG SL SUBL: 0.4000 mg | SUBLINGUAL_TABLET | SUBLINGUAL | Status: DC | PRN

## 2012-10-01 MED ORDER — TAMSULOSIN HCL 0.4 MG PO CAPS
0.4000 mg | ORAL_CAPSULE | Freq: Every day | ORAL | Status: DC
  Administered 2012-10-02: 0.4 mg via ORAL
  Filled 2012-10-01 (×2): qty 1

## 2012-10-01 MED ORDER — PANTOPRAZOLE SODIUM 40 MG PO TBEC
40.0000 mg | DELAYED_RELEASE_TABLET | Freq: Every day | ORAL | Status: DC
  Administered 2012-10-02: 40 mg via ORAL
  Filled 2012-10-01: qty 1

## 2012-10-01 MED ORDER — TOBRAMYCIN 0.3 % OP SOLN
1.0000 [drp] | Freq: Four times a day (QID) | OPHTHALMIC | Status: DC
  Administered 2012-10-01: 1 [drp] via OPHTHALMIC
  Filled 2012-10-01 (×2): qty 5

## 2012-10-01 MED ORDER — LOTEPREDNOL ETABONATE 0.5 % OP SUSP
1.0000 [drp] | Freq: Four times a day (QID) | OPHTHALMIC | Status: DC
  Administered 2012-10-01 – 2012-10-02 (×2): 1 [drp] via OPHTHALMIC
  Filled 2012-10-01 (×2): qty 5

## 2012-10-01 MED ORDER — LORATADINE 10 MG PO TABS
10.0000 mg | ORAL_TABLET | Freq: Every day | ORAL | Status: DC
  Filled 2012-10-01: qty 1

## 2012-10-01 NOTE — ED Notes (Addendum)
EMS was called by family for a possible stroke.  They stated pt has increased weakness with intermittent slurred speech and difficulty swallowing since Sat.  Cbg 150.  Hx of advanced alzheimers.  Per family, pt mental status is normal.  No facial droop or slurred speech.  Pt needed assistance to ambulate to the stretcher, but was able to move both LE.  Wife also states pt is grabbing chest, thus ems did ekg.  Pt denies pain at this time.

## 2012-10-01 NOTE — ED Provider Notes (Signed)
CSN: [id number]     Arrival date & time 10/01/12  1115 History   First MD Initiated Contact with Patient 10/01/12 1127     Chief Complaint  Patient presents with  . Weakness  PCP [place]  (Consider location/radiation/quality/duration/timing/severity/associated sxs/prior Treatment) HPI This 79 year old male has a history of dementia lives at home with his wife at baseline he is able to walk unassisted with a walker, he was last known well 2 days ago, for the last 2 days he has had slightly slurred speech and difficulty swallowing, his wife is at too crushing his food and feet exam for the last 2 days, today he is generally weak and too weak to walk by himself today requiring assistance to walk which is not typical for him, his wife is not strong enough to assist his walking today, due to paramedics to help him walk today, he does not have any lateralizing weakness or numbness he has no chest pain no shortness of breath no fever no change in his baseline confusion he is oriented to himself only which is baseline for him, there's been no trauma, no vomiting no fever and no other concerns. Past Medical History  Diagnosis Date  . Alzheimer's dementia   . Dementia   . Asthma   . Sinus congestion   . Arthritis     legs hands  . Renal disorder     renal lithiasis  . Heartburn   . Hypercholesteremia   . Hypertension   . Depression    Past Surgical History  Procedure Laterality Date  . Lithotripsy    . Eye surgery     No family history on file. History  Substance Use Topics  . Smoking status: Former Games developer  . Smokeless tobacco: Not on file  . Alcohol Use: No     Comment: used to drink    Review of Systems  Unable to perform ROS: Dementia    Allergies  Review of patient's allergies indicates no known allergies.  Home Medications   No current outpatient prescriptions on file. BP 115/59  Pulse 90  Temp(Src) 97.2 F (36.2 C) (Axillary)  Resp 18  Ht 5\' 4"  (1.626 m)  Wt 208 lb  (94.348 kg)  BMI 35.69 kg/m2  SpO2 96% Physical Exam  Nursing note and vitals reviewed. Constitutional:  Awake, alert, nontoxic appearance with slightly slurred speech for patient according to wife  HENT:  Head: Atraumatic.  Mouth/Throat: No oropharyngeal exudate.  Eyes: EOM are normal. Pupils are equal, round, and reactive to light. Right eye exhibits no discharge. Left eye exhibits no discharge.  Neck: Neck supple.  Cardiovascular: Normal rate and regular rhythm.   No murmur heard. Pulmonary/Chest: Effort normal and breath sounds normal. No stridor. No respiratory distress. He has no wheezes. He has no rales. He exhibits no tenderness.  Abdominal: Soft. Bowel sounds are normal. He exhibits no mass. There is no tenderness. There is no rebound.  Musculoskeletal: He exhibits no tenderness.  Baseline ROM, moves extremities with no obvious new focal weakness.  Lymphadenopathy:    He has no cervical adenopathy.  Neurological: He is alert.  Awake, alert, cooperative oriented only to person which is baseline for the patient; motor strength 5 out of 5 in the arms and 4/5 in his legs bilaterally; sensation normal to light touch bilaterally; peripheral visual fields full to confrontation; no facial asymmetry; tongue midline; major cranial nerves appear intact; no pronator drift in his arms but he is unable to lift either leg  off the bed due to weakness bilaterally, normal finger to nose bilaterally, according to EMS patient required to assistance to try to walk and was too weak to walk prior to arrival with his walker by himself or with assistance from his wife  Skin: No rash noted.  Psychiatric: He has a normal mood and affect.    ED Course  Procedures (including critical care time) ECG: Sinus rhythm, ventricular rate 81, right axis deviation, incomplete right branch block, nonspecific ST depression, prolonged QT interval 504 ms, no comparison ECG immediately available Patient / Family /  Caregiver understand and agree with initial ED impression and plan with expectations set for ED visit. Pt stable in ED with no significant deterioration in condition. Patient / Family / Caregiver informed of clinical course, understand medical decision-making process, and agree with plan. D/w [facility] for admit suspect CVA (not Code Stroke due to onset over 2 days). Labs Review Labs Reviewed  CBC - Abnormal; Notable for the following:    RBC 4.19 (*)    All other components within normal limits  COMPREHENSIVE METABOLIC PANEL - Abnormal; Notable for the following:    Glucose, Bld 105 (*)    BUN 29 (*)    Creatinine, Ser 2.74 (*)    Albumin 3.4 (*)    GFR calc non Af Amer 19 (*)    GFR calc Af Amer 22 (*)    All other components within normal limits  URINALYSIS, ROUTINE W REFLEX MICROSCOPIC - Abnormal; Notable for the following:    APPearance CLOUDY (*)    Hgb urine dipstick MODERATE (*)    Leukocytes, UA TRACE (*)    All other components within normal limits  GLUCOSE, CAPILLARY - Abnormal; Notable for the following:    Glucose-Capillary 101 (*)    All other components within normal limits  CREATININE, SERUM - Abnormal; Notable for the following:    Creatinine, Ser 2.59 (*)    GFR calc non Af Amer 20 (*)    GFR calc Af Amer 23 (*)    All other components within normal limits  POCT I-STAT, CHEM 8 - Abnormal; Notable for the following:    BUN 30 (*)    Creatinine, Ser 3.10 (*)    Glucose, Bld 105 (*)    All other components within normal limits  ETHANOL  PROTIME-INR  APTT  DIFFERENTIAL  TROPONIN I  URINE RAPID DRUG SCREEN (HOSP PERFORMED)  URINE MICROSCOPIC-ADD ON  CBC  POCT I-STAT TROPONIN I   Imaging Review Dg Chest 2 View  10/01/2012   *RADIOLOGY REPORT*  Clinical Data: Chest pain with shortness of breath.  CHEST - 2 VIEW  Comparison: 04/06/2009  Findings: Degraded lateral view secondary arm position and clothing artifact posteriorly.  Moderate right hemidiaphragm elevation.   Cardiomegaly accentuated by AP portable technique.  No pleural effusion or pneumothorax.  No congestive failure.  Mildly low lung volumes with bibasilar volume loss and atelectasis.  IMPRESSION: Cardiomegaly and low lung volumes, without acute disease.   Original Report Authenticated By: Jeronimo Greaves, M.D.   Ct Head Wo Contrast  10/01/2012   *RADIOLOGY REPORT*  Clinical Data: Slurred speech and swallowing problems with leg weakness for 2 days.  CT HEAD WITHOUT CONTRAST  Technique:  Contiguous axial images were obtained from the base of the skull through the vertex without contrast.  Comparison: None.  Findings: Examination demonstrates mild prominence of the lateral and third ventricles as well as mild prominence of the CSF spaces likely age related  atrophy.  There is minimal chronic ischemic microvascular disease present.  There is no mass, mass effect, shift midline structures or acute hemorrhage.  There is no evidence of acute infarction.  Remaining bones and soft tissues are within normal.  IMPRESSION: No acute intracranial findings.  Mild atrophy and chronic ischemic microvascular disease.   Original Report Authenticated By: Elberta Fortis, M.D.   Mri Brain Without Contrast  10/02/2012   *RADIOLOGY REPORT*  Clinical Data: Intermittent slurred speech.  MRI HEAD WITHOUT CONTRAST  Technique:  Multiplanar, multiecho pulse sequences of the brain and surrounding structures were obtained according to standard protocol without intravenous contrast.  Comparison: CT head without contrast 10/01/2012.  Findings: The diffusion weighted images demonstrate no evidence for acute or subacute infarction.  Moderate generalized atrophy and periventricular white matter changes bilaterally likely reflects the sequelae of chronic microvascular ischemia.  A remote infarct is evident in the medial posterior right parietal lobe.  Remote lacunar infarcts are present within the cerebellum bilaterally. Flow is present in the major  intracranial arteries.  The patient is status post bilateral lens extractions.  The globes and orbits are otherwise within normal limits.  The paranasal sinuses and mastoid air cells are clear.  IMPRESSION:  1.  No acute intracranial abnormality. 2.  Moderate atrophy and white matter disease likely reflects the sequelae of chronic microvascular ischemia. 3.  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   Original Report Authenticated By: Marin Roberts, M.D.    MDM   1. Stroke   2. Dementia   3. Weakness   4. Acute on chronic renal failure   5. TIA (transient ischemic attack)    The patient appears reasonably stabilized for admission considering the current resources, flow, and capabilities available in the ED at this time, and I doubt any other [facility] requiring further screening and/or treatment in the ED prior to admission.    Hurman Horn, MD 10/02/12 [phone]

## 2012-10-01 NOTE — ED Notes (Signed)
PT became agitated when attempting to in-and-out cath for urine.  Pt began kicking and hitting staff - pt with great strength in all extremities.

## 2012-10-01 NOTE — H&P (Signed)
[facility] History and Physical  [name] [name] ZOX:[id number] DOB: July 09, 1919 DOA: 10/01/2012  Referring physician: Dr Fonnie Jarvis. PCP: Pcp Not In System  Specialists: none  Chief Complaint: weakness, intermittent slurred speech.   HPI: [name] [name] is a 79 y.o. male with PMH significant for alzheimer 's diseases, CKD stage IV, Cr baseline 2.1, hypertension, who was brought to ED by care giver because of worsening weakness, and intermittent slurred speech for last couples of days. Per caregiver no history of fever, cough, diarrhea, constipation. Patient is not able to provide history due to dementia, at baseline he get agitated, no oriented, unable to carry a conversation. His speech was clear. He denies pain.   Review of Systems: unable to obtain due to patient condition.   Past Medical History  Diagnosis Date  . Alzheimer's dementia   . Dementia   . Asthma   . Sinus congestion   . Arthritis     legs hands  . Renal disorder     renal lithiasis  . Heartburn   . Hypercholesteremia   . Hypertension   . Depression    Past Surgical History  Procedure Laterality Date  . Lithotripsy    . Eye surgery     Social History:  reports that he has quit smoking. He does not have any smokeless tobacco history on file. He reports that he does not drink alcohol or use illicit drugs.   No Known Allergies  Family History: Father and mother both expired.    Prior to Admission medications   Medication Sig Start Date End Date Taking? Authorizing Provider  Albuterol Sulfate (PROAIR HFA IN) Inhale 90 mcg into the lungs daily.      Historical Provider, MD  cetirizine (ZYRTEC) 10 MG tablet Take 10 mg by mouth daily.      Historical Provider, MD  clopidogrel (PLAVIX) 75 MG tablet Take 75 mg by mouth daily.      Historical Provider, MD  donepezil (ARICEPT) 10 MG tablet Take 10 mg by mouth daily.      Historical Provider, MD  esomeprazole (NEXIUM) 40 MG capsule Take 40 mg by mouth daily.       Historical Provider, MD  finasteride (PROSCAR) 5 MG tablet Take 5 mg by mouth daily.      Historical Provider, MD  Hydrocodone-APAP-Dietary Prod (HYDROCODONE-APAP-NUTRIT SUPP) 5-500 MG MISC Take 5-500 mg by mouth 2 (two) times daily.      Historical Provider, MD  LORazepam (ATIVAN) 1 MG tablet Take 1 mg by mouth daily.      Historical Provider, MD  memantine (NAMENDA) 10 MG tablet Take 10 mg by mouth 2 (two) times daily.      Historical Provider, MD  methocarbamol (ROBAXIN) 500 MG tablet Take 500 mg by mouth 2 (two) times daily.      Historical Provider, MD  nitrofurantoin (MACRODANTIN) 50 MG capsule Take 50 mg by mouth daily.      Historical Provider, MD  NORTRIPTYLINE HCL PO Take 25 mg by mouth 2 (two) times daily.      Historical Provider, MD  simvastatin (ZOCOR) 20 MG tablet Take 20 mg by mouth daily.      Historical Provider, MD  Tamsulosin HCl (FLOMAX) 0.4 MG CAPS Take 0.4 mg by mouth daily.      Historical Provider, MD  triamcinolone (NASACORT AQ) 55 MCG/ACT nasal inhaler Place 0.6 sprays into the nose daily.      Historical Provider, MD   Physical Exam: Filed Vitals:   10/01/12  1329  BP:   Temp: 98.3 F (36.8 C)  Resp:    General Appearance:    Alert, cooperative, no distress, appears stated age  Head:    Normocephalic, without obvious abnormality, atraumatic  Eyes:    PERRL, conjunctiva/corneas clear, EOM's intact.      Ears:    Normal TM's and external ear canals, both ears  Nose:   Nares normal, septum midline, mucosa normal, no drainage    or sinus tenderness  Throat:   Lips, mucosa, and tongue normal; teeth and gums normal  Neck:   Supple, symmetrical, trachea midline, no adenopathy;       thyroid:  No enlargement/tenderness/nodules; no carotid   bruit or JVD     Lungs:     Clear to auscultation bilaterally, respirations unlabored  Chest wall:    No tenderness or deformity  Heart:    Regular rate and rhythm, S1 and S2 normal, no murmur, rub   or gallop  Abdomen:      Soft, non-tender, bowel sounds active all four quadrants,    no masses, no organomegaly  Genitalia:    Deferred.   Rectal:    Deferred.  Extremities:   Extremities normal, atraumatic, no cyanosis or edema  Pulses:   2+ and symmetric all extremities  Skin:   Skin color, texture, turgor normal, no rashes or lesions  Lymph nodes:   Cervical, supraclavicular, and axillary nodes normal  Neurologic:   Patient agitated at times, speech clear, not following command, moves all four extremities.       Labs on Admission:  Basic Metabolic Panel:  Recent Labs Lab 10/01/12 1203 10/01/12 1209  NA 140 145  K 4.3 4.3  CL 106 109  CO2 23  --   GLUCOSE 105* 105*  BUN 29* 30*  CREATININE 2.74* 3.10*  CALCIUM 9.6  --    Liver Function Tests:  Recent Labs Lab 10/01/12 1203  AST 23  ALT 21  ALKPHOS 107  BILITOT 0.3  PROT 7.3  ALBUMIN 3.4*   CBC:  Recent Labs Lab 10/01/12 1203 10/01/12 1209  WBC 6.6  --   NEUTROABS 4.6  --   HGB 14.1 13.3  HCT 40.1 39.0  MCV 95.7  --   PLT 200  --    Cardiac Enzymes:  Recent Labs Lab 10/01/12 1203  TROPONINI <0.30    BNP (last 3 results) No results found for this basename: PROBNP,  in the last 8760 hours CBG:  Recent Labs Lab 10/01/12 1159  GLUCAP 101*    Radiological Exams on Admission: Ct Head Wo Contrast  10/01/2012   *RADIOLOGY REPORT*  Clinical Data: Slurred speech and swallowing problems with leg weakness for 2 days.  CT HEAD WITHOUT CONTRAST  Technique:  Contiguous axial images were obtained from the base of the skull through the vertex without contrast.  Comparison: None.  Findings: Examination demonstrates mild prominence of the lateral and third ventricles as well as mild prominence of the CSF spaces likely age related atrophy.  There is minimal chronic ischemic microvascular disease present.  There is no mass, mass effect, shift midline structures or acute hemorrhage.  There is no evidence of acute infarction.  Remaining  bones and soft tissues are within normal.  IMPRESSION: No acute intracranial findings.  Mild atrophy and chronic ischemic microvascular disease.   Original Report Authenticated By: Elberta Fortis, M.D.    EKG: Independently reviewed. Prolong QT, incomplete RBBB  Assessment/Plan Principal Problem:   Acute on chronic  renal failure Active Problems:   Alzheimer's disease   Encephalopathy   TIA (transient ischemic attack)  1-Acute on Chronic renal failure: Patient renal baseline at 2.7. component of pre renal. Patient not eating well for last couple of days. Will hold lasix. IV fluids. Repeat renal function in am.   2-Slurred speech, weakness, more confuse: Differential TIA, worsening dementia, infectious process. Check UA. MRI brain. Continue with aspirin and plavix. Monitor on telemetry. Check Chest x ray. If MRI positive for stroke he will need further work up. _unclear if prior history of A fib, patient is on amiodarone.   3-Dementia: continue with Namenda, Nortriptyline. Fall precaution.   4-Dysphagia: Per caregiver patient having difficulty with swallow. Will order speech therapy.  5-Systolic Heart Failure: appear compensated. Last ECHO 2010 EF 45 %. Continue with metoprolol, Imdur, hold lasix due to worsening renal function.        Code Status: caregiver, who was patient wife relates patient wanted to be DNR. This will need to be clarified with rest of family (childrens)  Family Communication: Care discussed with care giver who was at bedside.  Disposition Plan: expect 2 to 3 days inpatient.   Time spent: 65 minutes.   Francella Barnett [facility] Pager [phone]  If 7PM-7AM, please contact night-coverage [email or web address] Password [email or web address] 10/01/2012, 2:42 PM

## 2012-10-01 NOTE — ED Notes (Signed)
Admitting MD at bedside.

## 2012-10-02 ENCOUNTER — Inpatient Hospital Stay (HOSPITAL_COMMUNITY): Payer: PRIVATE HEALTH INSURANCE

## 2012-10-02 DIAGNOSIS — I6350 Cerebral infarction due to unspecified occlusion or stenosis of unspecified cerebral artery: Secondary | ICD-10-CM

## 2012-10-02 NOTE — Progress Notes (Signed)
Chaplain responded to page from nurse concerning pt's interest in Advance Directive. Upon arrival, informed by nurse that pt has history of dementia and alzheimer's. Pt was sleeping, but his wife was awake. Discussed with her whether pt is "sound of mind"/thinking clearly in order to discuss his wishes for an AD. She was hesitant and expressed it was unlikely. However, she expressed interest in completed an AD while she is here and would like more information. Additionally, wife expressed tensions between her and adult stepchildren. Will refer for AD information and assessment.  Maurene Capes, Chaplain Resident

## 2012-10-02 NOTE — Evaluation (Signed)
Clinical/Bedside Swallow Evaluation Patient Details  Name: [name] [name] MRN: [id number] Date of Birth: 03-Sep-1919  Today's Date: 10/02/2012 Time: 1130-1146 SLP Time Calculation (min): 16 min  Past Medical History:  Past Medical History  Diagnosis Date  . Alzheimer's dementia   . Dementia   . Asthma   . Sinus congestion   . Arthritis     legs hands  . Renal disorder     renal lithiasis  . Heartburn   . Hypercholesteremia   . [name] [name]   . Depression    Past Surgical History:  Past Surgical History  Procedure Laterality Date  . Lithotripsy    . Eye surgery     HPI:  [name] [name] is a 79 y.o. male with PMH significant for alzheimer 's [name], [name] [name], [name] [name], [name] [name], who was brought to ED by care giver because of worsening weakness, and intermittent slurred speech for last couples of days. Per caregiver no history of fever, cough, diarrhea, constipation. Patient is not able to provide history due to dementia, at baseline he get agitated, no oriented, unable to carry a conversation. His speech was clear.    Assessment / Plan / Recommendation Clinical Impression  Pt with dementia exhibiting decreased automaticity of swallowing prior to admit. Pt had a remote hx of dysphagia in 2010 with acute illness, Wife returned to thin liquids after a year with no episodes of pna since then.   Upon todays assessment pt is self feeding with some 'swishing' of water prior to swallow, but no evidence of aspiration or penetration, able to masticate solids without holding or residuals. Suspect acute illness impacted function, now improved with medical interventions. Educated wife regarding fluctuation in funition due to dementia, discussed s/s of aspiration and precautions when needed. Today, pt is ready to resume a soft, dys 3 (mechanical soft) diet with thin liquids and full supervision. SLP will f/u x1 to check for tolerance. No skilled SLP f/u needed after d/c at this  time.     Aspiration Risk  Mild    Diet Recommendation Dysphagia 3 (Mechanical Soft);Thin liquid   Liquid Administration via: Cup;Straw Medication Administration: Whole meds with liquid Supervision: Patient able to self feed;Full supervision/cueing for compensatory strategies Compensations: Slow rate;Small sips/bites Postural Changes and/or Swallow Maneuvers: Seated upright 90 degrees    Other  Recommendations Oral Care Recommendations: Oral care BID   Follow Up Recommendations  None    Frequency and Duration min 1 x/week  1 week   Pertinent Vitals/Pain NA    SLP Swallow Goals Patient will utilize recommended strategies during swallow to increase swallowing safety with: Maximum assistance Swallow Study Goal #2 - Progress: Progressing toward goal   Swallow Study Prior Functional Status       General HPI: [name] [name] is a 79 y.o. male with PMH significant for alzheimer 's [name], [name] [name], [name] [name], [name] [name], who was brought to ED by care giver because of worsening weakness, and intermittent slurred speech for last couples of days. Per caregiver no history of fever, cough, diarrhea, constipation. Patient is not able to provide history due to dementia, at baseline he get agitated, no oriented, unable to carry a conversation. His speech was clear.  Previous Swallow Assessment: MBS 2010 - report not available, MD notes say delayed swallow/holding, dys 1/nectar Diet Prior to this Study: NPO Temperature Spikes Noted: No Respiratory Status: Room air History of Recent Intubation: No Behavior/Cognition: Alert;Cooperative;Requires cueing;Pleasant mood;Confused Oral Cavity - Dentition: Dentures, top;Dentures, not available (  missing bottom denutre, wife will bring) Self-Feeding Abilities: Able to feed self;Needs assist Patient Positioning: Upright in bed Baseline Vocal Quality: Clear Volitional Cough: Strong Volitional Swallow: Unable to elicit     Oral/Motor/Sensory Function Overall Oral Motor/Sensory Function: Appears within functional limits for tasks assessed   Ice Chips     Thin Liquid Thin Liquid: Within functional limits Presentation: Cup;Straw;Self Fed    Nectar Thick Nectar Thick Liquid: Not tested   Honey Thick Honey Thick Liquid: Not tested   Puree Puree: Within functional limits   Solid   GO    Solid: Within functional limits       [name] [name], [name] [name] 10/02/2012,11:52 AM

## 2012-10-02 NOTE — Progress Notes (Signed)
   CARE MANAGEMENT NOTE 10/02/2012  Patient:  Cent[place]   Account Number:  [id number]  Date Initiated:  10/02/2012  Documentation initiated by:  Jiles Crocker  Subjective/Objective Assessment:   ADMITTED WITH TIA     Action/Plan:   LIVES AT HOME WITH SPOUSE; CM FOLLOWING FOR DCP   Anticipated DC Date:  10/09/2012   Anticipated DC Plan:  [facility] - AWAITING ON PT/OT EVALS   In-house referral  Clinical Social Worker      DC Associate Professor  CM consult         Status of service:  In process, will continue to follow Medicare Important Message given?  NA - LOS <3 / Initial given by admissions (If response is "NO", the following Medicare IM given date fields will be blank)  Per UR Regulation:  Reviewed for med. necessity/level of care/duration of stay  Comments:  9/8/2014Abelino Derrick RN,BSN,MHA - [phone]

## 2012-10-02 NOTE — Progress Notes (Signed)
Talked to patient with wife present about DCP/ home health care choices; spouse chose [facility] for home health care needs; Mary with [facility] called for arrangements; Alexis Goodell [phone]

## 2012-10-02 NOTE — Progress Notes (Signed)
Chaplain followed up with patient and family per the request of on-call chaplain.  After spending some time with the patient, the family informed me that he suffered from a dementia related illness.  Chaplain and wife spoke about advance directives.Provided spiritual care and communication sharing. Chaplain follow-up requested.     10/02/12 1133  Clinical Encounter Type  Visited With Patient and family together  Visit Type Follow-up;Spiritual support  Referral From Chaplain  Consult/Referral To Chaplain  Spiritual Encounters  Spiritual Needs Emotional  Stress Factors  Patient Stress Factors None identified  Family Stress Factors Major life changes;Lack of knowledge;Financial concerns  Susa Day [phone]

## 2012-10-02 NOTE — Discharge Summary (Addendum)
Physician Discharge Summary  [name] [name] ZOX:[id number] DOB: 09/21/1919 DOA: 10/01/2012  PCP: Pcp Not In System  Admit date: 10/01/2012 Discharge date: 10/02/2012  Time spent: 40 minutes  Recommendations for Outpatient Follow-up:  1. Recommend goals of care as an outpatient patient has advanced dementia 2. Recommend primary care physician simplify and decrease polypharmacy as this may contribute to patient's mental state 3. PT/RN requested for home therapy  Discharge Diagnoses:  Principal Problem:   TIA (transient ischemic attack) Active Problems:   Alzheimer's disease   Acute on chronic renal failure   Encephalopathy   Discharge Condition: Stable  Diet recommendation: Liberalize diet completely-do not control  Filed Weights   10/01/12 1545  Weight: 94.348 kg (208 lb)    History of present illness:  79 year old African American ? 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 Patient was found to be a subsequent baseline on day subsequent to admission. It was noted that in the past in March 2014 and then November 2008 he had deferred workup for cardiac causes- I had a long discussion with his wife today about workup for CVA and I do not think that given his significant and advanced dementia that there is any role to change anything in terms of his medications, or even work him up further. I would not recommend placing him on high intensity statin, he is already on a beta blocker, and I believe in his case at doing less is actually going to be more beneficial to him I have discontinued his head thiazide given  ckd3 A,iodarone should be considered as a high risk med  Procedures: Mri brain remote infarct  Consultations:  none  Discharge Exam: Filed Vitals:   10/02/12 0931  BP: 132/56  Pulse: 59  Temp: 97.8 F (36.6 C)  Resp: 18   alert  General: eomi Cardiovascular: s1 s2 no m/r/g Respiratory: clear.  Discharge Instructions  Discharge Orders   Future Orders Complete By Expires   Diet - low sodium heart healthy  As directed    Discharge instructions  As directed    Comments:     You were cared for by a hospitalist during your hospital stay. If you have any questions about your discharge medications or the care you received while you were in the hospital after you are discharged, you can call the unit and asked to speak with the hospitalist on call if the hospitalist that took care of you is not available. Once you are discharged, your primary care physician will handle any further medical issues. Please note that NO REFILLS for any discharge medications will be authorized once you are discharged, as it is imperative that you return to your primary care physician (or establish a relationship with a primary care physician if you do not have one) for your aftercare needs so that they can reassess your need for medications and monitor your lab values. If you do not have a primary care physician, you can call [phone] for a physician referral.   Increase activity slowly  As directed        Medication List    STOP taking these medications  albuterol 108 (90 BASE) MCG/ACT inhaler  Commonly known as:  PROVENTIL HFA;VENTOLIN HFA     cetirizine 10 MG tablet  Commonly known as:  ZYRTEC     diclofenac sodium 1 % Gel  Commonly known as:  VOLTAREN     furosemide 40 MG tablet  Commonly known as:  LASIX     potassium chloride 10 MEQ tablet  Commonly known as:  K-DUR     VITAMIN B50 COMPLEX PO      TAKE these medications       amiodarone 200 MG tablet  Commonly known as:   PACERONE  Take 100 mg by mouth daily.     aspirin EC 81 MG tablet  Take 81 mg by mouth daily.     carboxymethylcellulose 0.5 % Soln  Commonly known as:  REFRESH PLUS  Place 1 drop into both eyes 3 (three) times daily as needed (dry eyes).     clopidogrel 75 MG tablet  Commonly known as:  PLAVIX  Take 75 mg by mouth daily.     ergocalciferol 50000 UNITS capsule  Commonly known as:  VITAMIN D2  Take 50,000 Units by mouth once a week. Tuesday     esomeprazole 40 MG capsule  Commonly known as:  NEXIUM  Take 40 mg by mouth daily before breakfast.     finasteride 5 MG tablet  Commonly known as:  PROSCAR  Take 5 mg by mouth daily.     fish oil-omega-3 fatty acids 1000 MG capsule  Take 1 g by mouth daily.     fluticasone 50 MCG/ACT nasal spray  Commonly known as:  FLONASE  Place 2 sprays into the nose daily as needed for rhinitis.     isosorbide mononitrate 30 MG 24 hr tablet  Commonly known as:  IMDUR  Take 30 mg by mouth daily.     levothyroxine 75 MCG tablet  Commonly known as:  SYNTHROID, LEVOTHROID  Take 75 mcg by mouth daily before breakfast.     memantine 10 MG tablet  Commonly known as:  NAMENDA  Take 10 mg by mouth 2 (two) times daily.     metoprolol succinate 25 MG 24 hr tablet  Commonly known as:  TOPROL-XL  Take 12.5 mg by mouth every evening.     nitroGLYCERIN 0.4 MG SL tablet  Commonly known as:  NITROSTAT  Place 0.4 mg under the tongue every 5 (five) minutes as needed for chest pain.     nortriptyline 75 MG capsule  Commonly known as:  PAMELOR  Take 75 mg by mouth at bedtime.     SUPER B COMPLEX/VITAMIN C PO  Take 1 tablet by mouth daily.     tamsulosin 0.4 MG Caps capsule  Commonly known as:  FLOMAX  Take 0.4 mg by mouth daily.     VICODIN 5-300 MG Tabs  Generic drug:  Hydrocodone-Acetaminophen  Take 1 tablet by mouth daily as needed (pain).     ZYLET 0.5-0.3 % Susp  Generic drug:  Loteprednol-Tobramycin  Place 1 drop into the right eye 4  (four) times daily.       No Known Allergies    The results of significant diagnostics from this hospitalization (including imaging, microbiology, ancillary and laboratory) are listed below for reference.    Significant Diagnostic Studies: Dg Chest 2 View  10/01/2012   *RADIOLOGY REPORT*  Clinical Data: Chest pain with shortness of breath.  CHEST - 2 VIEW  Comparison: 04/06/2009  Findings: Degraded lateral view secondary  arm position and clothing artifact posteriorly.  Moderate right hemidiaphragm elevation.  Cardiomegaly accentuated by AP portable technique.  No pleural effusion or pneumothorax.  No congestive failure.  Mildly low lung volumes with bibasilar volume loss and atelectasis.  IMPRESSION: Cardiomegaly and low lung volumes, without acute disease.   Original Report Authenticated By: Jeronimo Greaves, M.D.   Ct Head Wo Contrast  10/01/2012   *RADIOLOGY REPORT*  Clinical Data: Slurred speech and swallowing problems with leg weakness for 2 days.  CT HEAD WITHOUT CONTRAST  Technique:  Contiguous axial images were obtained from the base of the skull through the vertex without contrast.  Comparison: None.  Findings: Examination demonstrates mild prominence of the lateral and third ventricles as well as mild prominence of the CSF spaces likely age related atrophy.  There is minimal chronic ischemic microvascular disease present.  There is no mass, mass effect, shift midline structures or acute hemorrhage.  There is no evidence of acute infarction.  Remaining bones and soft tissues are within normal.  IMPRESSION: No acute intracranial findings.  Mild atrophy and chronic ischemic microvascular disease.   Original Report Authenticated By: Elberta Fortis, M.D.   Mri Brain Without Contrast  10/02/2012   *RADIOLOGY REPORT*  Clinical Data: Intermittent slurred speech.  MRI HEAD WITHOUT CONTRAST  Technique:  Multiplanar, multiecho pulse sequences of the brain and surrounding structures were obtained according to  standard protocol without intravenous contrast.  Comparison: CT head without contrast 10/01/2012.  Findings: The diffusion weighted images demonstrate no evidence for acute or subacute infarction.  Moderate generalized atrophy and periventricular white matter changes bilaterally likely reflects the sequelae of chronic microvascular ischemia.  A remote infarct is evident in the medial posterior right parietal lobe.  Remote lacunar infarcts are present within the cerebellum bilaterally. Flow is present in the major intracranial arteries.  The patient is status post bilateral lens extractions.  The globes and orbits are otherwise within normal limits.  The paranasal sinuses and mastoid air cells are clear.  IMPRESSION:  1.  No acute intracranial abnormality. 2.  Moderate atrophy and white matter disease likely reflects the sequelae of chronic microvascular ischemia. 3.  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   Original Report Authenticated By: Marin Roberts, M.D.    Microbiology: No results found for this or any previous visit (from the past 240 hour(s)).   Labs: Basic Metabolic Panel:  Recent Labs Lab 10/01/12 1203 10/01/12 1209 10/01/12 1650  NA 140 145  --   K 4.3 4.3  --   CL 106 109  --   CO2 23  --   --   GLUCOSE 105* 105*  --   BUN 29* 30*  --   CREATININE 2.74* 3.10* 2.59*  CALCIUM 9.6  --   --    Liver Function Tests:  Recent Labs Lab 10/01/12 1203  AST 23  ALT 21  ALKPHOS 107  BILITOT 0.3  PROT 7.3  ALBUMIN 3.4*   No results found for this basename: LIPASE, AMYLASE,  in the last 168 hours No results found for this basename: AMMONIA,  in the last 168 hours CBC:  Recent Labs Lab 10/01/12 1203 10/01/12 1209 10/01/12 1650  WBC 6.6  --  6.5  NEUTROABS 4.6  --   --   HGB 14.1 13.3 14.0  HCT 40.1 39.0 41.8  MCV 95.7  --  96.3  PLT 200  --  198   Cardiac Enzymes:  Recent Labs Lab 10/01/12  1203  TROPONINI <0.30    BNP: BNP (last 3 results) No results found for this basename: PROBNP,  in the last 8760 hours CBG:  Recent Labs Lab 10/01/12 1159  GLUCAP 101*       Signed:  Rhetta Mura  [facility] 10/02/2012, 2:15 PM

## 2012-10-02 NOTE — Evaluation (Signed)
Physical Therapy Evaluation Patient Details Name: [name] [name] MRN: [id number] DOB: 16-Oct-1919 Today's Date: 10/02/2012 Time: 6578-4696 PT Time Calculation (min): 27 min  PT Assessment / Plan / Recommendation History of Present Illness  Pt presented with slurring of speech and weakness all over, could not walk  Clinical Impression  Pt presents with generalized weakness but is still at min A level with mobility (had been at min-guard PTA). Recommend return home with [facility] and HHPT/ OT. Wife mentions that hand held showerhead recently broke and they need a new one. Otherwise have needed equipment. PT will follow acutely to help increase strength and safe mobility to increase independence and safety and decrease caregiver burden.    PT Assessment  Patient needs continued PT services    Follow Up Recommendations  Home health PT;Supervision/Assistance - 24 hour    Does the patient have the potential to tolerate intense rehabilitation      Barriers to Discharge        Equipment Recommendations  Other (comment) (needs new hand held shower head)    Recommendations for Other Services OT consult   Frequency Min 3X/week    Precautions / Restrictions Precautions Precautions: Fall Restrictions Weight Bearing Restrictions: No   Pertinent Vitals/Pain No c/o pain      Mobility  Bed Mobility Bed Mobility: Supine to Sit;Sitting - Scoot to Edge of Bed Supine to Sit: 3: Mod assist;HOB elevated;With rails Sitting - Scoot to Edge of Bed: 3: Mod assist Details for Bed Mobility Assistance: pt pivoted with HOB elevated, required mod A to get hips out of center of bed though this assist was needed partially due to cognition and pt internally distracted, not fully attending to task Transfers Transfers: Sit to Stand;Stand to Sit Sit to Stand: 4: Min assist;From bed;With upper extremity assist Stand to Sit: 3: Mod assist;To chair/3-in-1;With upper extremity assist Details for Transfer  Assistance: min A to stand though pt extended knees slowly and needed min A for push up as well as to steady. Performed transfer multiple times as pt kept sitting back down. Mod A for stand to sit only because pt trying to sit on arm of chair and not following commands to scoot to middle. Facilitation at hips needed Ambulation/Gait Ambulation/Gait Assistance: 4: Min assist Ambulation Distance (Feet): 5 Feet Assistive device: Rolling walker Ambulation/Gait Assistance Details: min A to steady with ambulation Gait Pattern: Step-through pattern;Decreased stride length;Shuffle;Trunk flexed Gait velocity: decreased Stairs: No Wheelchair Mobility Wheelchair Mobility: No    Exercises     PT Diagnosis: Abnormality of gait;Generalized weakness  PT Problem List: Decreased strength;Decreased activity tolerance;Decreased balance;Decreased cognition;Decreased mobility;Decreased safety awareness;Decreased knowledge of precautions PT Treatment Interventions: DME instruction;Gait training;Functional mobility training;Therapeutic activities;Therapeutic exercise;Balance training;Cognitive remediation;Patient/family education     PT Goals(Current goals can be found in the care plan section) Acute Rehab PT Goals Patient Stated Goal: return home PT Goal Formulation: With patient/family Time For Goal Achievement: 10/16/12 Potential to Achieve Goals: Good  Visit Information  Last PT Received On: 10/02/12 Assistance Needed: +1 History of Present Illness: Pt presented with slurring of speech and weakness all over, could not walk       Prior Functioning  Home Living Family/patient expects to be discharged to:: Private residence Living Arrangements: Spouse/significant other Available Help at Discharge: Family;Available 24 hours/day;Personal care attendant Type of Home: House Home Access: Level entry Home Layout: One level Home Equipment: Walker - 2 wheels;Walker - 4 wheels;Bedside commode;Wheelchair -  manual;Tub bench;Shower seat Additional Comments: hand held shower recently  broke, need a new one, [facility] comes 7 days/ wk, sometimes 5hrs, sometimes 6hrs Prior Function Level of Independence: Needs assistance Gait / Transfers Assistance Needed: min-guard A ADL's / Homemaking Assistance Needed: assist needed for bathing and dressing Communication Communication: HOH Dominant Hand: Left    Cognition  Cognition Arousal/Alertness: Awake/alert Behavior During Therapy: WFL for tasks assessed/performed Overall Cognitive Status: History of cognitive impairments - at baseline Memory: Decreased short-term memory    Extremity/Trunk Assessment Upper Extremity Assessment Upper Extremity Assessment: RUE deficits/detail RUE Deficits / Details: decreased use of RUE since CVA but able to bear wt through it and grasp for use of RW Lower Extremity Assessment Lower Extremity Assessment: Generalized weakness;RLE deficits/detail RLE Deficits / Details: weaker than left, difficult to fully evaluate as pt does not follow all commands. Able to move leg perpendicular to gravity but did not see him lift directly up. Generalized weakness noted bilateral LE with functional activity Cervical / Trunk Assessment Cervical / Trunk Assessment: Kyphotic   Balance Balance Balance Assessed: Yes Static Standing Balance Static Standing - Balance Support: Bilateral upper extremity supported;During functional activity Static Standing - Level of Assistance: 4: Min assist  End of Session PT - End of Session Equipment Utilized During Treatment: Gait belt Activity Tolerance: Patient tolerated treatment well Patient left: in chair;with call bell/phone within reach;with family/visitor present Nurse Communication: Mobility status  GP   Lyanne Co, PT  Acute Rehab Services  [phone]   Lyanne Co 10/02/2012, 1:43 PM

## 2012-10-03 NOTE — Progress Notes (Signed)
Late entry for missed G-code. Based on chart review of evaluation by Lyanne Co, PT.  October 22, 2012 1304  PT G-Codes **NOT FOR INPATIENT CLASS**  Functional Assessment Tool Used clinical judgment based on chart review  Functional Limitation Mobility: Walking and moving around  Mobility: Walking and Moving Around Current Status (W1191) CJ  Mobility: Walking and Moving Around Goal Status [phone]) CI  Lavona Mound, PT  [phone] 10/03/2012

## 2012-11-20 ENCOUNTER — Encounter: Payer: Self-pay | Admitting: Podiatry

## 2012-11-20 ENCOUNTER — Ambulatory Visit (INDEPENDENT_AMBULATORY_CARE_PROVIDER_SITE_OTHER): Payer: Medicare Other | Admitting: Podiatry

## 2012-11-20 VITALS — BP 137/83 | HR 76 | Resp 16

## 2012-11-20 DIAGNOSIS — B351 Tinea unguium: Secondary | ICD-10-CM

## 2012-11-20 DIAGNOSIS — M79609 Pain in unspecified limb: Secondary | ICD-10-CM

## 2012-11-20 NOTE — Progress Notes (Signed)
Subjective:     Patient ID: [name] [name], male   DOB: 1919-05-06, 79 y.o.   MRN: [id number]  HPI patient presents with caregiver who states the nails are thickened and possible to cut and painful for the patient   Review of Systems     Objective:   Physical Exam    neurovascular status is intact with incurvated nail bed 1-5 bilateral with pain when pressed Assessment:     Mycotic nail infections 1-5 bilateral with discomfort    Plan:     Debridement of painful nailbeds 1-5 bilateral with no iatrogenic bleeding noted

## 2013-02-21 ENCOUNTER — Ambulatory Visit: Payer: Medicare Other | Admitting: Podiatry

## 2013-02-28 ENCOUNTER — Ambulatory Visit (INDEPENDENT_AMBULATORY_CARE_PROVIDER_SITE_OTHER): Payer: Medicare HMO | Admitting: Podiatry

## 2013-02-28 ENCOUNTER — Encounter: Payer: Self-pay | Admitting: Podiatry

## 2013-02-28 VITALS — BP 131/77 | HR 72 | Resp 16

## 2013-02-28 DIAGNOSIS — B351 Tinea unguium: Secondary | ICD-10-CM

## 2013-02-28 DIAGNOSIS — M79609 Pain in unspecified limb: Secondary | ICD-10-CM

## 2013-03-01 NOTE — Progress Notes (Signed)
Subjective:     Patient ID: [name] [name], male   DOB: 01-12-1920, 80 y.o.   MRN: [id number]  HPI patient presents in a wheelchair with nail disease 1-5 both feet that get thick painful and he cannot cut them himself   Review of Systems     Objective:   Physical Exam Neurovascular unchanged with thick nail disease and discomfort 1-5 both feet    Assessment:     Mycotic nail infection with discomfort 1-5 both feet    Plan:     Debridement of painful nail bed 1-5 both feet with no iatrogenic bleeding noted

## 2013-04-29 ENCOUNTER — Emergency Department (HOSPITAL_COMMUNITY): Payer: Medicare HMO

## 2013-04-29 ENCOUNTER — Encounter (HOSPITAL_COMMUNITY): Payer: Self-pay | Admitting: Emergency Medicine

## 2013-04-29 ENCOUNTER — Observation Stay (HOSPITAL_COMMUNITY)
Admission: EM | Admit: 2013-04-29 | Discharge: 2013-04-30 | Disposition: A | Discharge status: Home / Self Care | Payer: Medicare HMO | Attending: Internal Medicine | Admitting: Internal Medicine

## 2013-04-29 DIAGNOSIS — E7800 Pure hypercholesterolemia, unspecified: Secondary | ICD-10-CM | POA: Insufficient documentation

## 2013-04-29 DIAGNOSIS — M1710 Unilateral primary osteoarthritis, unspecified knee: Secondary | ICD-10-CM | POA: Insufficient documentation

## 2013-04-29 DIAGNOSIS — J45909 Unspecified asthma, uncomplicated: Secondary | ICD-10-CM | POA: Insufficient documentation

## 2013-04-29 DIAGNOSIS — F32A Depression, unspecified: Secondary | ICD-10-CM

## 2013-04-29 DIAGNOSIS — Z87438 Personal history of other diseases of male genital organs: Secondary | ICD-10-CM

## 2013-04-29 DIAGNOSIS — R55 Syncope and collapse: Principal | ICD-10-CM

## 2013-04-29 DIAGNOSIS — M1990 Unspecified osteoarthritis, unspecified site: Secondary | ICD-10-CM

## 2013-04-29 DIAGNOSIS — N183 Chronic kidney disease, stage 3 unspecified: Secondary | ICD-10-CM | POA: Diagnosis present

## 2013-04-29 DIAGNOSIS — R609 Edema, unspecified: Secondary | ICD-10-CM | POA: Insufficient documentation

## 2013-04-29 DIAGNOSIS — Z87442 Personal history of urinary calculi: Secondary | ICD-10-CM | POA: Insufficient documentation

## 2013-04-29 DIAGNOSIS — M19049 Primary osteoarthritis, unspecified hand: Secondary | ICD-10-CM | POA: Insufficient documentation

## 2013-04-29 DIAGNOSIS — Z7982 Long term (current) use of aspirin: Secondary | ICD-10-CM | POA: Insufficient documentation

## 2013-04-29 DIAGNOSIS — Z7902 Long term (current) use of antithrombotics/antiplatelets: Secondary | ICD-10-CM | POA: Insufficient documentation

## 2013-04-29 DIAGNOSIS — IMO0002 Reserved for concepts with insufficient information to code with codable children: Secondary | ICD-10-CM | POA: Insufficient documentation

## 2013-04-29 DIAGNOSIS — Z79899 Other long term (current) drug therapy: Secondary | ICD-10-CM | POA: Insufficient documentation

## 2013-04-29 DIAGNOSIS — R12 Heartburn: Secondary | ICD-10-CM | POA: Insufficient documentation

## 2013-04-29 DIAGNOSIS — N189 Chronic kidney disease, unspecified: Secondary | ICD-10-CM

## 2013-04-29 DIAGNOSIS — I129 Hypertensive chronic kidney disease with stage 1 through stage 4 chronic kidney disease, or unspecified chronic kidney disease: Secondary | ICD-10-CM | POA: Insufficient documentation

## 2013-04-29 DIAGNOSIS — G309 Alzheimer's disease, unspecified: Secondary | ICD-10-CM | POA: Insufficient documentation

## 2013-04-29 DIAGNOSIS — F0280 Dementia in other diseases classified elsewhere without behavioral disturbance: Secondary | ICD-10-CM

## 2013-04-29 DIAGNOSIS — F329 Major depressive disorder, single episode, unspecified: Secondary | ICD-10-CM

## 2013-04-29 DIAGNOSIS — F3289 Other specified depressive episodes: Secondary | ICD-10-CM | POA: Insufficient documentation

## 2013-04-29 DIAGNOSIS — N179 Acute kidney failure, unspecified: Secondary | ICD-10-CM

## 2013-04-29 DIAGNOSIS — Z87891 Personal history of nicotine dependence: Secondary | ICD-10-CM | POA: Insufficient documentation

## 2013-04-29 HISTORY — DX: Gastrointestinal hemorrhage, unspecified: K92.2

## 2013-04-29 HISTORY — DX: Gastro-esophageal reflux disease without esophagitis: K21.9

## 2013-04-29 HISTORY — DX: Hypothyroidism, unspecified: E03.9

## 2013-04-29 HISTORY — DX: Personal history of other medical treatment: Z92.89

## 2013-04-29 HISTORY — DX: Gout, unspecified: M10.9

## 2013-04-29 HISTORY — DX: Anemia, unspecified: D64.9

## 2013-04-29 HISTORY — DX: Benign prostatic hyperplasia without lower urinary tract symptoms: N40.0

## 2013-04-29 LAB — COMPREHENSIVE METABOLIC PANEL
ALT: 17 U/L (ref 0–53)
AST: 21 U/L (ref 0–37)
Albumin: 3 g/dL — ABNORMAL LOW (ref 3.5–5.2)
Alkaline Phosphatase: 125 U/L — ABNORMAL HIGH (ref 39–117)
BILIRUBIN TOTAL: 0.2 mg/dL — AB (ref 0.3–1.2)
BUN: 30 mg/dL — ABNORMAL HIGH (ref 6–23)
CHLORIDE: 108 meq/L (ref 96–112)
CO2: 22 meq/L (ref 19–32)
Calcium: 9.3 mg/dL (ref 8.4–10.5)
Creatinine, Ser: 2.84 mg/dL — ABNORMAL HIGH (ref 0.50–1.35)
GFR, EST AFRICAN AMERICAN: 21 mL/min — AB (ref >90)
GFR, EST NON AFRICAN AMERICAN: 18 mL/min — AB (ref >90)
GLUCOSE: 114 mg/dL — AB (ref 70–99)
Potassium: 4.6 mEq/L (ref 3.7–5.3)
SODIUM: 145 meq/L (ref 137–147)
Total Protein: 6.7 g/dL (ref 6.0–8.3)

## 2013-04-29 LAB — CBC WITH DIFFERENTIAL/PLATELET
BASOS ABS: 0 10*3/uL (ref 0.0–0.1)
Basophils Relative: 0 % (ref 0–1)
Eosinophils Absolute: 0.1 10*3/uL (ref 0.0–0.7)
Eosinophils Relative: 2 % (ref 0–5)
HEMATOCRIT: 40.2 % (ref 39.0–52.0)
Hemoglobin: 13.8 g/dL (ref 13.0–17.0)
LYMPHS ABS: 0.9 10*3/uL (ref 0.7–4.0)
LYMPHS PCT: 15 % (ref 12–46)
MCH: 33.1 pg (ref 26.0–34.0)
MCHC: 34.3 g/dL (ref 30.0–36.0)
MCV: 96.4 fL (ref 78.0–100.0)
MONO ABS: 0.4 10*3/uL (ref 0.1–1.0)
Monocytes Relative: 6 % (ref 3–12)
NEUTROS ABS: 4.7 10*3/uL (ref 1.7–7.7)
Neutrophils Relative %: 77 % (ref 43–77)
PLATELETS: 193 10*3/uL (ref 150–400)
RBC: 4.17 MIL/uL — AB (ref 4.22–5.81)
RDW: 14.9 % (ref 11.5–15.5)
WBC: 6.1 10*3/uL (ref 4.0–10.5)

## 2013-04-29 LAB — I-STAT TROPONIN, ED: Troponin i, poc: 0 ng/mL (ref 0.00–0.08)

## 2013-04-29 MED ORDER — NITROGLYCERIN 0.4 MG SL SUBL: 0.4000 mg | SUBLINGUAL_TABLET | SUBLINGUAL | Status: DC | PRN

## 2013-04-29 MED ORDER — ONDANSETRON HCL 4 MG PO TABS: 4.0000 mg | ORAL_TABLET | Freq: Four times a day (QID) | ORAL | Status: DC | PRN

## 2013-04-29 MED ORDER — TAMSULOSIN HCL 0.4 MG PO CAPS
0.4000 mg | ORAL_CAPSULE | Freq: Every day | ORAL | Status: DC
  Administered 2013-04-30: 0.4 mg via ORAL
  Filled 2013-04-29: qty 1

## 2013-04-29 MED ORDER — ACETAMINOPHEN 325 MG PO TABS: 650.0000 mg | ORAL_TABLET | Freq: Four times a day (QID) | ORAL | Status: DC | PRN

## 2013-04-29 MED ORDER — LEVOTHYROXINE SODIUM 75 MCG PO TABS
75.0000 ug | ORAL_TABLET | Freq: Every day | ORAL | Status: DC
  Administered 2013-04-30: 75 ug via ORAL
  Filled 2013-04-29 (×2): qty 1

## 2013-04-29 MED ORDER — ALBUTEROL SULFATE (2.5 MG/3ML) 0.083% IN NEBU: 2.5000 mg | INHALATION_SOLUTION | Freq: Four times a day (QID) | RESPIRATORY_TRACT | Status: DC | PRN

## 2013-04-29 MED ORDER — METOPROLOL SUCCINATE 12.5 MG HALF TABLET
12.5000 mg | ORAL_TABLET | Freq: Every evening | ORAL | Status: DC
Start: 2013-04-29 — End: 2013-04-30
  Administered 2013-04-29: 12.5 mg via ORAL
  Filled 2013-04-29 (×2): qty 1

## 2013-04-29 MED ORDER — ASPIRIN EC 81 MG PO TBEC
81.0000 mg | DELAYED_RELEASE_TABLET | Freq: Every day | ORAL | Status: DC
  Filled 2013-04-29: qty 1

## 2013-04-29 MED ORDER — SODIUM CHLORIDE 0.9 % IV SOLN
INTRAVENOUS | Status: DC
  Administered 2013-04-29: 22:00:00 via INTRAVENOUS

## 2013-04-29 MED ORDER — FLUTICASONE PROPIONATE 50 MCG/ACT NA SUSP
2.0000 | Freq: Every day | NASAL | Status: DC | PRN
  Filled 2013-04-29: qty 16

## 2013-04-29 MED ORDER — ONDANSETRON HCL 4 MG/2ML IJ SOLN: 4.0000 mg | Freq: Four times a day (QID) | INTRAMUSCULAR | Status: DC | PRN

## 2013-04-29 MED ORDER — HEPARIN SODIUM (PORCINE) 5000 UNIT/ML IJ SOLN
5000.0000 [IU] | Freq: Three times a day (TID) | INTRAMUSCULAR | Status: DC
  Filled 2013-04-29 (×5): qty 1

## 2013-04-29 MED ORDER — MEMANTINE HCL 10 MG PO TABS
10.0000 mg | ORAL_TABLET | Freq: Two times a day (BID) | ORAL | Status: DC
Start: 2013-04-29 — End: 2013-04-30
  Administered 2013-04-29 – 2013-04-30 (×2): 10 mg via ORAL
  Filled 2013-04-29 (×3): qty 1

## 2013-04-29 MED ORDER — NORTRIPTYLINE HCL 25 MG PO CAPS
75.0000 mg | ORAL_CAPSULE | Freq: Every day | ORAL | Status: DC
  Administered 2013-04-29: 75 mg via ORAL
  Filled 2013-04-29 (×2): qty 3

## 2013-04-29 MED ORDER — PANTOPRAZOLE SODIUM 40 MG PO TBEC: 40.0000 mg | DELAYED_RELEASE_TABLET | Freq: Every day | ORAL | Status: DC

## 2013-04-29 MED ORDER — AMIODARONE HCL 100 MG PO TABS
100.0000 mg | ORAL_TABLET | Freq: Every day | ORAL | Status: DC
  Administered 2013-04-30: 100 mg via ORAL
  Filled 2013-04-29: qty 1

## 2013-04-29 MED ORDER — ALBUTEROL SULFATE HFA 108 (90 BASE) MCG/ACT IN AERS: 2.0000 | INHALATION_SPRAY | Freq: Four times a day (QID) | RESPIRATORY_TRACT | Status: DC | PRN

## 2013-04-29 MED ORDER — ISOSORBIDE MONONITRATE ER 30 MG PO TB24
30.0000 mg | ORAL_TABLET | Freq: Every day | ORAL | Status: DC
Start: 2013-04-30 — End: 2013-04-30
  Administered 2013-04-30: 30 mg via ORAL
  Filled 2013-04-29: qty 1

## 2013-04-29 MED ORDER — CLOPIDOGREL BISULFATE 75 MG PO TABS
75.0000 mg | ORAL_TABLET | ORAL | Status: DC
Start: 2013-05-01 — End: 2013-04-30

## 2013-04-29 MED ORDER — ACETAMINOPHEN 650 MG RE SUPP: 650.0000 mg | Freq: Four times a day (QID) | RECTAL | Status: DC | PRN

## 2013-04-29 NOTE — ED Provider Notes (Signed)
CSN: [id number]     Arrival date & time 04/29/13  1337 History   First MD Initiated Contact with Patient 04/29/13 1338     Chief Complaint  Patient presents with  . Loss of Consciousness     (Consider location/radiation/quality/duration/timing/severity/associated sxs/prior Treatment) HPI  This a 80 year old male with history of Alzheimer's dementia, asthma,  Chronic kidney disease who presents with possible syncopal episode. Patient's caregiver states the patient was in the bathroom. He sat down on the toilet. She he appeared to go unresponsive for a period of minutes. She did not notice any seizure activity. Upon EMS arrival, he was back to his baseline. Patient is awake and alert at this time. He has dementia and is noncontributory to history taking. He denies any pain. He denies any chest pain or shortness of breath.  Past Medical History  Diagnosis Date  . Alzheimer's dementia   . Dementia   . Asthma   . Sinus congestion   . Arthritis     legs hands  . Renal disorder     renal lithiasis  . Heartburn   . Hypercholesteremia   . Hypertension   . Depression    Past Surgical History  Procedure Laterality Date  . Lithotripsy    . Eye surgery     History reviewed. No pertinent family history. History  Substance Use Topics  . Smoking status: Former Games developer  . Smokeless tobacco: Not on file  . Alcohol Use: No     Comment: used to drink    Review of Systems  Unable to perform ROS: Dementia      Allergies  Review of patient's allergies indicates no known allergies.  Home Medications   Current Outpatient Rx  Name  Route  Sig  Dispense  Refill  . albuterol (PROVENTIL HFA;VENTOLIN HFA) 108 (90 BASE) MCG/ACT inhaler   Inhalation   Inhale 2 puffs into the lungs every 6 (six) hours as needed for wheezing or shortness of breath.         Marland Kitchen amiodarone (PACERONE) 200 MG tablet   Oral   Take 100 mg by mouth daily.         Marland Kitchen aspirin EC 81 MG tablet   Oral   Take 81 mg  by mouth daily.         . B Complex-C (SUPER B COMPLEX/VITAMIN C PO)   Oral   Take 1 tablet by mouth daily.         . carbamide peroxide (DEBROX) 6.5 % otic solution   Both Ears   Place 5 drops into both ears 2 (two) times daily.         . cetirizine (ZYRTEC) 10 MG tablet   Oral   Take 10 mg by mouth daily.         . clopidogrel (PLAVIX) 75 MG tablet   Oral   Take 75 mg by mouth every other day.          . ergocalciferol (VITAMIN D2) 50000 UNITS capsule   Oral   Take 50,000 Units by mouth once a week. Tuesday         . esomeprazole (NEXIUM) 40 MG capsule   Oral   Take 40 mg by mouth daily before breakfast.         . finasteride (PROSCAR) 5 MG tablet   Oral   Take 5 mg by mouth daily.           . fluticasone (FLONASE) 50 MCG/ACT nasal spray  Nasal   Place 2 sprays into the nose daily as needed for rhinitis.         Marland Kitchen. Hydrocodone-Acetaminophen (VICODIN) 5-300 MG TABS   Oral   Take 1 tablet by mouth daily as needed (pain).         . isosorbide mononitrate (IMDUR) 30 MG 24 hr tablet   Oral   Take 30 mg by mouth daily.         Marland Kitchen. levothyroxine (SYNTHROID, LEVOTHROID) 75 MCG tablet   Oral   Take 75 mcg by mouth daily before breakfast.         . Loteprednol-Tobramycin (ZYLET) 0.5-0.3 % SUSP   Right Eye   Place 1 drop into the right eye 4 (four) times daily.         . memantine (NAMENDA) 10 MG tablet   Oral   Take 10 mg by mouth 2 (two) times daily.           . metoprolol succinate (TOPROL-XL) 25 MG 24 hr tablet   Oral   Take 12.5 mg by mouth every evening.         . nortriptyline (PAMELOR) 75 MG capsule   Oral   Take 75 mg by mouth at bedtime.         . tamsulosin (FLOMAX) 0.4 MG CAPS capsule   Oral   Take 0.4 mg by mouth daily.         . nitroGLYCERIN (NITROSTAT) 0.4 MG SL tablet   Sublingual   Place 0.4 mg under the tongue every 5 (five) minutes as needed for chest pain.          BP 103/66  Pulse 81  Temp(Src)  97.7 F (36.5 C)  Resp 22  SpO2 100% Physical Exam  Nursing note and vitals reviewed. Constitutional: No distress.  Elderly, no acute distress  HENT:  Head: Normocephalic and atraumatic.  Mouth/Throat: Oropharynx is clear and moist.  Eyes: Pupils are equal, round, and reactive to light.  Neck: Neck supple.  Cardiovascular: Normal rate, regular rhythm and normal heart sounds.   No murmur heard. Pulmonary/Chest: Effort normal and breath sounds normal. No respiratory distress. He has no wheezes.  Abdominal: Soft. Bowel sounds are normal. There is no tenderness. There is no rebound.  Musculoskeletal: He exhibits edema.  Lymphadenopathy:    He has no cervical adenopathy.  Neurological: He is alert.  Oriented to self only, follow simple commands, moves all 4 extremities  Skin: Skin is warm and dry.  Psychiatric: He has a normal mood and affect.    ED Course  Procedures (including critical care time) Labs Review Labs Reviewed  CBC WITH DIFFERENTIAL - Abnormal; Notable for the following:    RBC 4.17 (*)    All other components within normal limits  COMPREHENSIVE METABOLIC PANEL  URINALYSIS, ROUTINE W REFLEX MICROSCOPIC  I-STAT TROPOININ, ED   Imaging Review Ct Head Wo Contrast  04/29/2013   CLINICAL DATA:  Loss of consciousness  EXAM: CT HEAD WITHOUT CONTRAST  TECHNIQUE: Contiguous axial images were obtained from the base of the skull through the vertex without intravenous contrast. Study was obtained within 24 hr of patient's arrival at the emergency department.  COMPARISON:  Brain CT October 01, 2012 and brain MRI October 02, 2012  FINDINGS: Moderately severe diffuse atrophy is again noted. There is no mass, hemorrhage, extra-axial fluid collection, or midline shift. There is patchy small vessel disease in the centra semiovale bilaterally. There is no new gray-white compartment lesion.  There is no demonstrable acute infarct. Basal ganglia calcification is felt to be physiologic in  this age group.  Bony calvarium appears intact. Visualized mastoid air cells are clear.  IMPRESSION: Atrophy with patchy periventricular small vessel disease. No intracranial mass, hemorrhage, or acute appearing infarct.   Electronically Signed   By: Bretta Bang M.D.   On: 04/29/2013 14:27   Dg Chest Portable 1 View  04/29/2013   CLINICAL DATA:  Loss of consciousness  EXAM: PORTABLE CHEST - 1 VIEW  COMPARISON:  10/01/2012  FINDINGS: Cardiomediastinal silhouette is stable. Elevation of the right hemidiaphragm again noted. Streaky left basilar atelectasis or infiltrate. No pulmonary edema.  IMPRESSION: No pulmonary edema.  Streaky left basilar atelectasis or infiltrate.   Electronically Signed   By: Natasha Mead M.D.   On: 04/29/2013 14:48     EKG Interpretation   Date/Time:  Sunday April 29 2013 13:45:25 EDT Ventricular Rate:  82 PR Interval:  201 QRS Duration: 136 QT Interval:  428 QTC Calculation: 500 R Axis:   -106 Text Interpretation:  Sinus rhythm RBBB and LAFB Similar to prior  Confirmed by HORTON  MD, Toni Amend ([id number]) on 04/29/2013 1:54:54 PM      MDM   Final diagnoses:  None    80 year old who presents with an episode of unresponsiveness. His back to baseline. Grossly intact neurologically. BMP and urinalysis still pending. EKG shows no evidence of arrhythmia. Troponin negative.  Shon Baton, MD 04/29/13 [phone]

## 2013-04-29 NOTE — ED Notes (Signed)
Spoke with Dr. Arthor CaptainElmahi, reporting there is no need for NIH or swallow screen.

## 2013-04-29 NOTE — ED Notes (Signed)
Pt needed diaper change from being soiled. Pt uncooperative with changing, Pt using profane language and grabbing at staff and bed rails. Caregiver called to bedside to assist.

## 2013-04-29 NOTE — ED Notes (Signed)
Admitting MD at bedside.

## 2013-04-29 NOTE — ED Notes (Signed)
Pt refusing in and out cath. EDP made aware.

## 2013-04-29 NOTE — ED Provider Notes (Signed)
4:00 PM  Assumed care from Dr. Wilkie AyeHorton. Patient is a 80 year old male with history of dementia, hypertension, hyperlipidemia, hypothyroidism who had a syncopal event today. His labs are unremarkable. He is back to his baseline but due to his dementia and is unable to give much history. Plan was for admission for syncopal workup. His PCP is with [facility]. Spoke with Dr. Arthor CaptainElmahi with hospitalist service for admission  [facility] MedKristen N Ward, DO 04/29/13 1649

## 2013-04-29 NOTE — ED Notes (Signed)
Pt returned from CT °

## 2013-04-29 NOTE — ED Notes (Signed)
Assisted Providence CrosbySirisha, EMT and Maralyn SagoSarah, RN with changing of pt's diaper; pt was being very uncooperative, grabbing at staff, himself and the stretcher using profanity; caregiver asked to assist but was little help

## 2013-04-29 NOTE — ED Notes (Signed)
Unable to I/O cath. Pt due mental status, pt grabbing at staff constricting body. Rn made aware.

## 2013-04-29 NOTE — ED Notes (Signed)
EMS reports pt had syncopal episode at home. Pt responded to painful stimuli upon EMS arrival and then fully "woke up" and was "back to baseline" by the time pt was on ambulance. Pt has hx of alz and dementia. Alert and oriented to person. Pt is in NAD. Caretaker at bedside.

## 2013-04-29 NOTE — H&P (Signed)
[facility] History and Physical  [name] JonesVirnel Allard WUJ:[id number] DOB: May 15, 1919 DOA: 04/29/2013  Referring physician: Ward PCP: Dorrene GermanAVBUERE,EDWIN A, MD   Chief Complaint: Presyncope  HPI: [name] [name] is a 80 y.o. male with past medical history of hypertension, previous stroke and advanced dementia. Patient given to the hospital because of what appears to be presyncopal episode. According to his guardian was in the room at the time of this interview said he had bowel movement, he is aide was bathing him after she finished he got upset and he started yell and when he tries to stand up he couldn't, at that time he was sitting at the toilet bowl. He was awake but he was staring and was unresponsive for about 1-2 minutes. He never lost consciousness or fell. The call the rescue squad and when they reached home he was talking to them without any problems. In the ED patient is back to his baseline according to his family, CT head was negative, chest x-ray was negative, urinalysis is pending because patient was unable to follow commands appropriately. Patient will be admitted overnight for observation.  Review of Systems:  Unable to obtain because of advanced dementia.  Past Medical History  Diagnosis Date  . Alzheimer's dementia   . Dementia   . Asthma   . Sinus congestion   . Arthritis     legs hands  . Renal disorder     renal lithiasis  . Heartburn   . Hypercholesteremia   . Hypertension   . Depression    Past Surgical History  Procedure Laterality Date  . Lithotripsy    . Eye surgery     Social History:   reports that he has quit smoking. He does not have any smokeless tobacco history on file. He reports that he does not drink alcohol or use illicit drugs.  No Known Allergies  History reviewed. No pertinent family history.   Prior to Admission medications   Medication Sig Start Date End Date Taking? Authorizing Provider  albuterol (PROVENTIL HFA;VENTOLIN HFA) 108 (90  BASE) MCG/ACT inhaler Inhale 2 puffs into the lungs every 6 (six) hours as needed for wheezing or shortness of breath.   Yes Historical Provider, MD  amiodarone (PACERONE) 200 MG tablet Take 100 mg by mouth daily.   Yes Historical Provider, MD  aspirin EC 81 MG tablet Take 81 mg by mouth daily.   Yes Historical Provider, MD  B Complex-C (SUPER B COMPLEX/VITAMIN C PO) Take 1 tablet by mouth daily.   Yes Historical Provider, MD  carbamide peroxide (DEBROX) 6.5 % otic solution Place 5 drops into both ears 2 (two) times daily.   Yes Historical Provider, MD  cetirizine (ZYRTEC) 10 MG tablet Take 10 mg by mouth daily.   Yes Historical Provider, MD  clopidogrel (PLAVIX) 75 MG tablet Take 75 mg by mouth every other day.    Yes Historical Provider, MD  ergocalciferol (VITAMIN D2) 50000 UNITS capsule Take 50,000 Units by mouth once a week. Tuesday   Yes Historical Provider, MD  esomeprazole (NEXIUM) 40 MG capsule Take 40 mg by mouth daily before breakfast.   Yes Historical Provider, MD  finasteride (PROSCAR) 5 MG tablet Take 5 mg by mouth daily.     Yes Historical Provider, MD  fluticasone (FLONASE) 50 MCG/ACT nasal spray Place 2 sprays into the nose daily as needed for rhinitis.   Yes Historical Provider, MD  Hydrocodone-Acetaminophen (VICODIN) 5-300 MG TABS Take 1 tablet by mouth daily as needed (pain).  Yes Historical Provider, MD  isosorbide mononitrate (IMDUR) 30 MG 24 hr tablet Take 30 mg by mouth daily.   Yes Historical Provider, MD  levothyroxine (SYNTHROID, LEVOTHROID) 75 MCG tablet Take 75 mcg by mouth daily before breakfast.   Yes Historical Provider, MD  Loteprednol-Tobramycin (ZYLET) 0.5-0.3 % SUSP Place 1 drop into the right eye 4 (four) times daily.   Yes Historical Provider, MD  memantine (NAMENDA) 10 MG tablet Take 10 mg by mouth 2 (two) times daily.     Yes Historical Provider, MD  metoprolol succinate (TOPROL-XL) 25 MG 24 hr tablet Take 12.5 mg by mouth every evening.   Yes Historical  Provider, MD  nortriptyline (PAMELOR) 75 MG capsule Take 75 mg by mouth at bedtime.   Yes Historical Provider, MD  tamsulosin (FLOMAX) 0.4 MG CAPS capsule Take 0.4 mg by mouth daily.   Yes Historical Provider, MD  nitroGLYCERIN (NITROSTAT) 0.4 MG SL tablet Place 0.4 mg under the tongue every 5 (five) minutes as needed for chest pain.    Historical Provider, MD   Physical Exam: Filed Vitals:   04/29/13 1706  BP: 134/66  Pulse: 74  Temp:   Resp: 21   Constitutional: Oriented to person, place, and time. Well-developed and well-nourished. Cooperative.  Head: Normocephalic and atraumatic.  Nose: Nose normal.  Mouth/Throat: Uvula is midline, oropharynx is clear and moist and mucous membranes are normal.  Eyes: Conjunctivae and EOM are normal. Pupils are equal, round, and reactive to light.  Neck: Trachea normal and normal range of motion. Neck supple.  Cardiovascular: Normal rate, regular rhythm, S1 normal, S2 normal, normal heart sounds and intact distal pulses.   Pulmonary/Chest: Effort normal and breath sounds normal.  Abdominal: Soft. Bowel sounds are normal. There is no hepatosplenomegaly. There is no tenderness.  Musculoskeletal: Normal range of motion.  Neurological: Alert and oriented to person, place, and time. He has normal strength. No cranial nerve deficit or sensory deficit.  Skin: Skin is warm, dry and intact.  Psychiatric: Has a normal mood and affect. Speech is normal and behavior is normal.   Labs on Admission:  Basic Metabolic Panel:  Recent Labs Lab 04/29/13 1504  NA 145  K 4.6  CL 108  CO2 22  GLUCOSE 114*  BUN 30*  CREATININE 2.84*  CALCIUM 9.3   Liver Function Tests:  Recent Labs Lab 04/29/13 1504  AST 21  ALT 17  ALKPHOS 125*  BILITOT 0.2*  PROT 6.7  ALBUMIN 3.0*   No results found for this basename: LIPASE, AMYLASE,  in the last 168 hours No results found for this basename: AMMONIA,  in the last 168 hours CBC:  Recent Labs Lab  04/29/13 1504  WBC 6.1  NEUTROABS 4.7  HGB 13.8  HCT 40.2  MCV 96.4  PLT 193   Cardiac Enzymes: No results found for this basename: CKTOTAL, CKMB, CKMBINDEX, TROPONINI,  in the last 168 hours  BNP (last 3 results) No results found for this basename: PROBNP,  in the last 8760 hours CBG: No results found for this basename: GLUCAP,  in the last 168 hours  Radiological Exams on Admission: Ct Head Wo Contrast  04/29/2013   CLINICAL DATA:  Loss of consciousness  EXAM: CT HEAD WITHOUT CONTRAST  TECHNIQUE: Contiguous axial images were obtained from the base of the skull through the vertex without intravenous contrast. Study was obtained within 24 hr of patient's arrival at the emergency department.  COMPARISON:  Brain CT October 01, 2012 and brain MRI  October 02, 2012  FINDINGS: Moderately severe diffuse atrophy is again noted. There is no mass, hemorrhage, extra-axial fluid collection, or midline shift. There is patchy small vessel disease in the centra semiovale bilaterally. There is no new gray-white compartment lesion. There is no demonstrable acute infarct. Basal ganglia calcification is felt to be physiologic in this age group.  Bony calvarium appears intact. Visualized mastoid air cells are clear.  IMPRESSION: Atrophy with patchy periventricular small vessel disease. No intracranial mass, hemorrhage, or acute appearing infarct.   Electronically Signed   By: Bretta Bang M.D.   On: 04/29/2013 14:27   Dg Chest Portable 1 View  04/29/2013   CLINICAL DATA:  Loss of consciousness  EXAM: PORTABLE CHEST - 1 VIEW  COMPARISON:  10/01/2012  FINDINGS: Cardiomediastinal silhouette is stable. Elevation of the right hemidiaphragm again noted. Streaky left basilar atelectasis or infiltrate. No pulmonary edema.  IMPRESSION: No pulmonary edema.  Streaky left basilar atelectasis or infiltrate.   Electronically Signed   By: Natasha Mead M.D.   On: 04/29/2013 14:48    EKG: Independently reviewed.    Assessment/Plan Principal Problem:   Syncope Active Problems:   Alzheimer's disease   Depression   History of BPH   CKD (chronic kidney disease), stage III    Presyncope -Appears to be a syncopal episode, patient did not lose his consciousness as per his guardian. -Patient did not fall or hit the ground. CT scan is negative, chest x-ray is negative. -12-lead EKG showed RBBB and LAFB, probable first degree AV block. -Had prolonged discussion with the patient guardian Mrs. Ex-Thiele Nedra Hai who was at bedside.  -The cause of advanced dementia, advanced age no extensive workup will be done. -I will do very doing CVA workup including swelling evaluation an MRI. -Patient will receive IV fluids, PT/OT and a.m. and evaluated his orthostatic vitals. -If patient doing okay he will be discharged home.  Alzheimer disease -Patient has advanced dementia, he lives at home with his guardian. -They have aide to help. -Patient is on Namenda, continued.  BPH  -Patient is on time finasteride and tamsulosin.  Hypertension -Patient is on Imdur and Toprol-XL, he was on these medications for some time she was asymptomatic. -I will not stop any of this medication for now, hydrate patient appropriately and rule out orthostatic hypotension.  Previous stroke -Patient is on aspirin and Plavix, continued. CT head is negative. -Vision has slurred speech which is his baseline, probable dysphagia as well. -Per patient's guardian patient eats pured food at home I will continue.   Code Status: DNR/DNI confirmed with his legal guardian Mrs. Ex-Thiele Nedra Hai .  Family Communication:  Plan discussed with his guardian at bedside. Disposition Plan: Observation  Time spent: 70 minutes  [facility] A [facility] Pager [phone]

## 2013-04-30 ENCOUNTER — Encounter (HOSPITAL_COMMUNITY): Payer: Self-pay | Admitting: General Practice

## 2013-04-30 DIAGNOSIS — R55 Syncope and collapse: Secondary | ICD-10-CM

## 2013-04-30 DIAGNOSIS — G309 Alzheimer's disease, unspecified: Secondary | ICD-10-CM

## 2013-04-30 DIAGNOSIS — N183 Chronic kidney disease, stage 3 unspecified: Secondary | ICD-10-CM

## 2013-04-30 DIAGNOSIS — M129 Arthropathy, unspecified: Secondary | ICD-10-CM

## 2013-04-30 DIAGNOSIS — F0280 Dementia in other diseases classified elsewhere without behavioral disturbance: Secondary | ICD-10-CM

## 2013-04-30 DIAGNOSIS — Z87898 Personal history of other specified conditions: Secondary | ICD-10-CM

## 2013-04-30 LAB — BASIC METABOLIC PANEL
BUN: 29 mg/dL — ABNORMAL HIGH (ref 6–23)
CALCIUM: 9 mg/dL (ref 8.4–10.5)
CHLORIDE: 111 meq/L (ref 96–112)
CO2: 22 mEq/L (ref 19–32)
CREATININE: 2.63 mg/dL — AB (ref 0.50–1.35)
GFR, EST AFRICAN AMERICAN: 23 mL/min — AB (ref >90)
GFR, EST NON AFRICAN AMERICAN: 20 mL/min — AB (ref >90)
Glucose, Bld: 106 mg/dL — ABNORMAL HIGH (ref 70–99)
Potassium: 5.1 mEq/L (ref 3.7–5.3)
Sodium: 146 mEq/L (ref 137–147)

## 2013-04-30 LAB — CBC
HCT: 38.9 % — ABNORMAL LOW (ref 39.0–52.0)
Hemoglobin: 13.2 g/dL (ref 13.0–17.0)
MCH: 32.8 pg (ref 26.0–34.0)
MCHC: 33.9 g/dL (ref 30.0–36.0)
MCV: 96.5 fL (ref 78.0–100.0)
Platelets: 176 10*3/uL (ref 150–400)
RBC: 4.03 MIL/uL — ABNORMAL LOW (ref 4.22–5.81)
RDW: 14.9 % (ref 11.5–15.5)
WBC: 8.1 10*3/uL (ref 4.0–10.5)

## 2013-04-30 LAB — TSH: TSH: 4.39 u[IU]/mL (ref 0.350–4.500)

## 2013-04-30 NOTE — Evaluation (Signed)
Occupational Therapy Evaluation Patient Details Name: [name] [name] MRN: [id number] DOB: Apr 07, 1919 Today's Date: 04/30/2013    History of Present Illness Pt admitted with pre-syncopal episode, had returne to his baseline while in the ED.  Pt has advanced Alzheimers.    Clinical Impression   Pt is dependent at baseline in all ADL. Pt's participation in activity/mobility is variable at baseline consistent with his dementia diagnosis.  Pt has good support of his significant other and has personal care services 7 days a week.  All equipment needs are met.  No further OT.   Follow Up Recommendations  No OT follow up;Supervision/Assistance - 24 hour    Equipment Recommendations  None recommended by OT    Recommendations for Other Services       Precautions / Restrictions Precautions Precautions: Fall Restrictions Weight Bearing Restrictions: No      Mobility Bed Mobility Overal bed mobility: +2 for physical assistance             General bed mobility comments: pt resistant to sitting at EOB  Transfers                 General transfer comment: Pt resistant to EOB, OOB activity despite multiple attempts.    Balance                                            ADL Overall ADL's : At baseline                                             Vision                     Perception     Praxis      Pertinent Vitals/Pain No pain, VSS     Hand Dominance Right   Extremity/Trunk Assessment Upper Extremity Assessment Upper Extremity Assessment: Overall WFL for tasks assessed   Lower Extremity Assessment Lower Extremity Assessment: Defer to PT evaluation       Communication Communication Communication: HOH   Cognition Arousal/Alertness: Awake/alert Behavior During Therapy: Restless Overall Cognitive Status: History of cognitive impairments - at baseline                     General Comments        Exercises       Shoulder Instructions      Home Living Family/patient expects to be discharged to:: Private residence Living Arrangements: Spouse/significant other Available Help at Discharge: Family;Available 24 hours/day;Personal care attendant Type of Home: Apartment Home Access: Level entry     Home Layout: One level     Bathroom Shower/Tub: Teacher, early years/pre: Standard     Home Equipment: Environmental consultant - 2 wheels;Walker - 4 wheels;Bedside commode;Wheelchair - manual;Tub bench;Shower seat;Hospital bed;Hand held shower head   Additional Comments: hand held shower and hosp bed recently broke, need a new one, [facility] comes 7 days/ wk, sometimes 5hrs, sometimes 6hrs      Prior Functioning/Environment Level of Independence: Needs assistance  Gait / Transfers Assistance Needed: varies from w/c dependent to min assist ADL's / Homemaking Assistance Needed: assist needed for bathing and dressing, supervision for eating        OT Diagnosis:  OT Problem List:     OT Treatment/Interventions:      OT Goals(Current goals can be found in the care plan section) Acute Rehab OT Goals Patient Stated Goal: guardian would like pt to discharge today  OT Frequency:     Barriers to D/C:            Co-evaluation              End of Session    Activity Tolerance: Treatment limited secondary to agitation Patient left: in bed;with call bell/phone within reach;with bed alarm set   Time: 5726-2035 OT Time Calculation (min): 34 min Charges:  OT General Charges $OT Visit: 1 Procedure OT Evaluation $Initial OT Evaluation Tier I: 1 Procedure OT Treatments $Self Care/Home Management : 8-22 mins G-Codes: OT G-codes **NOT FOR INPATIENT CLASS** Functional Assessment Tool Used: clinical judgement Functional Limitation: Self care Self Care Current Status (D9741): At least 80 percent but less than 100 percent impaired, limited or restricted Self Care Goal Status  (U3845): At least 80 percent but less than 100 percent impaired, limited or restricted Self Care Discharge Status [phone]): At least 80 percent but less than 100 percent impaired, limited or restricted  Malka So 04/30/2013, 9:41 AM

## 2013-04-30 NOTE — Discharge Summary (Signed)
Physician Discharge Summary  [name] [name]:[id number] DOB: 09-Sep-1919 DOA: 04/29/2013  PCP: Dorrene German, MD  Admit date: 04/29/2013 Discharge date: 04/30/2013  Time spent: 40 minutes  Recommendations for Outpatient Follow-up:  1. Followup with primary care physician within one week.  Discharge Diagnoses:  Principal Problem:   Syncope Active Problems:   Alzheimer's disease   Depression   History of BPH   CKD (chronic kidney disease), stage III   Discharge Condition: Fair  Diet recommendation: Heart healthy, consistency is dysphagia 3  There were no vitals filed for this visit.  History of present illness:  [name] [name] is a 80 y.o. male with past medical history of hypertension, previous stroke and advanced dementia. Patient given to the hospital because of what appears to be presyncopal episode. According to his guardian was in the room at the time of this interview said he had bowel movement, he is aide was bathing him after she finished he got upset and he started yell and when he tries to stand up he couldn't, at that time he was sitting at the toilet bowl. He was awake but he was staring and was unresponsive for about 1-2 minutes. He never lost consciousness or fell. The call the rescue squad and when they reached home he was talking to them without any problems.  In the ED patient is back to his baseline according to his family, CT head was negative, chest x-ray was negative, urinalysis is pending because patient was unable to follow commands appropriately. Patient will be admitted overnight for observation.  Hospital Course:   Presyncope  -Appears to be a syncopal episode, patient did not lose his consciousness as per his guardian.  -Patient did not fall or hit the ground. CT scan is negative, chest x-ray is negative.  -12-lead EKG showed RBBB and LAFB, probable first degree AV block.  -Had prolonged discussion with the patient guardian Mrs. Ex-Thiele Nedra Hai who was at  bedside.  -Because of advanced dementia, advanced age; no extensive workup will be done.  -I will refer doing CVA workup including swelling evaluation an MRI.  -Patient will receive IV fluids, PT/OT and a.m. and evaluated his orthostatic vitals.  -Discussed with his legal guardian Mrs. Ex-Thiele Nedra Hai, he'll be discharged home. -Advised strongly that she needs to have goals of care with primary care physician.  Alzheimer disease  -Patient has advanced dementia, he lives at home with his guardian.  -They have aide to help.  -Patient is on Namenda, continued.   BPH  -Patient is on time finasteride and tamsulosin.   Hypertension  -Patient is on Imdur and Toprol-XL, he was on these medications for some time she was asymptomatic.  -None of his medication stopped for now, hydrate patient appropriately and rule out orthostatic hypotension.   CKD stage III -Patient is about baseline, medications continued.  Previous stroke  -Patient is on aspirin and Plavix, continued. CT head is negative.  -Vision has slurred speech which is his baseline, probable dysphagia as well.  -Per patient's guardian patient eats pured food at home, that was continued.   Procedures:  None  Consultations:  None  Discharge Exam: Filed Vitals:   04/30/13 0500  BP: 103/63  Pulse: 92  Temp: 98.3 F (36.8 C)  Resp: 16   General: Alert and awake, oriented x3, not in any acute distress. HEENT: anicteric sclera, pupils reactive to light and accommodation, EOMI CVS: S1-S2 clear, no murmur rubs or gallops Chest: clear to auscultation bilaterally, no wheezing, rales or  rhonchi Abdomen: soft nontender, nondistended, normal bowel sounds, no organomegaly Extremities: no cyanosis, clubbing or edema noted bilaterally Neuro: Cranial nerves II-XII intact, no focal neurological deficits  Discharge Instructions You were cared for by a hospitalist during your hospital stay. If you have any questions about your  discharge medications or the care you received while you were in the hospital after you are discharged, you can call the unit and asked to speak with the hospitalist on call if the hospitalist that took care of you is not available. Once you are discharged, your primary care physician will handle any further medical issues. Please note that NO REFILLS for any discharge medications will be authorized once you are discharged, as it is imperative that you return to your primary care physician (or establish a relationship with a primary care physician if you do not have one) for your aftercare needs so that they can reassess your need for medications and monitor your lab values.  Discharge Orders   Future Appointments Provider Department Dept Phone   05/28/2013 10:30 AM Lenn Sink, DPM [facility] [phone]   Future Orders Complete By Expires   Diet - low sodium heart healthy  As directed    Increase activity slowly  As directed        Medication List         albuterol 108 (90 BASE) MCG/ACT inhaler  Commonly known as:  PROVENTIL HFA;VENTOLIN HFA  Inhale 2 puffs into the lungs every 6 (six) hours as needed for wheezing or shortness of breath.     amiodarone 200 MG tablet  Commonly known as:  PACERONE  Take 100 mg by mouth daily.     aspirin EC 81 MG tablet  Take 81 mg by mouth daily.     carbamide peroxide 6.5 % otic solution  Commonly known as:  DEBROX  Place 5 drops into both ears 2 (two) times daily.     cetirizine 10 MG tablet  Commonly known as:  ZYRTEC  Take 10 mg by mouth daily.     clopidogrel 75 MG tablet  Commonly known as:  PLAVIX  Take 75 mg by mouth every other day.     ergocalciferol 50000 UNITS capsule  Commonly known as:  VITAMIN D2  Take 50,000 Units by mouth once a week. Tuesday     esomeprazole 40 MG capsule  Commonly known as:  NEXIUM  Take 40 mg by mouth daily before breakfast.     finasteride 5 MG tablet  Commonly known as:   PROSCAR  Take 5 mg by mouth daily.     fluticasone 50 MCG/ACT nasal spray  Commonly known as:  FLONASE  Place 2 sprays into the nose daily as needed for rhinitis.     isosorbide mononitrate 30 MG 24 hr tablet  Commonly known as:  IMDUR  Take 30 mg by mouth daily.     levothyroxine 75 MCG tablet  Commonly known as:  SYNTHROID, LEVOTHROID  Take 75 mcg by mouth daily before breakfast.     memantine 10 MG tablet  Commonly known as:  NAMENDA  Take 10 mg by mouth 2 (two) times daily.     metoprolol succinate 25 MG 24 hr tablet  Commonly known as:  TOPROL-XL  Take 12.5 mg by mouth every evening.     nitroGLYCERIN 0.4 MG SL tablet  Commonly known as:  NITROSTAT  Place 0.4 mg under the tongue every 5 (five) minutes as needed for  chest pain.     nortriptyline 75 MG capsule  Commonly known as:  PAMELOR  Take 75 mg by mouth at bedtime.     SUPER B COMPLEX/VITAMIN C PO  Take 1 tablet by mouth daily.     tamsulosin 0.4 MG Caps capsule  Commonly known as:  FLOMAX  Take 0.4 mg by mouth daily.     VICODIN 5-300 MG Tabs  Generic drug:  Hydrocodone-Acetaminophen  Take 1 tablet by mouth daily as needed (pain).     ZYLET 0.5-0.3 % Susp  Generic drug:  Loteprednol-Tobramycin  Place 1 drop into the right eye 4 (four) times daily.       No Known Allergies    The results of significant diagnostics from this hospitalization (including imaging, microbiology, ancillary and laboratory) are listed below for reference.    Significant Diagnostic Studies: Ct Head Wo Contrast  04/29/2013   CLINICAL DATA:  Loss of consciousness  EXAM: CT HEAD WITHOUT CONTRAST  TECHNIQUE: Contiguous axial images were obtained from the base of the skull through the vertex without intravenous contrast. Study was obtained within 24 hr of patient's arrival at the emergency department.  COMPARISON:  Brain CT October 01, 2012 and brain MRI October 02, 2012  FINDINGS: Moderately severe diffuse atrophy is again noted.  There is no mass, hemorrhage, extra-axial fluid collection, or midline shift. There is patchy small vessel disease in the centra semiovale bilaterally. There is no new gray-white compartment lesion. There is no demonstrable acute infarct. Basal ganglia calcification is felt to be physiologic in this age group.  Bony calvarium appears intact. Visualized mastoid air cells are clear.  IMPRESSION: Atrophy with patchy periventricular small vessel disease. No intracranial mass, hemorrhage, or acute appearing infarct.   Electronically Signed   By: Bretta Bang M.D.   On: 04/29/2013 14:27   Dg Chest Portable 1 View  04/29/2013   CLINICAL DATA:  Loss of consciousness  EXAM: PORTABLE CHEST - 1 VIEW  COMPARISON:  10/01/2012  FINDINGS: Cardiomediastinal silhouette is stable. Elevation of the right hemidiaphragm again noted. Streaky left basilar atelectasis or infiltrate. No pulmonary edema.  IMPRESSION: No pulmonary edema.  Streaky left basilar atelectasis or infiltrate.   Electronically Signed   By: Natasha Mead M.D.   On: 04/29/2013 14:48    Microbiology: No results found for this or any previous visit (from the past 240 hour(s)).   Labs: Basic Metabolic Panel:  Recent Labs Lab 04/29/13 1504 04/30/13 0455  NA 145 146  K 4.6 5.1  CL 108 111  CO2 22 22  GLUCOSE 114* 106*  BUN 30* 29*  CREATININE 2.84* 2.63*  CALCIUM 9.3 9.0   Liver Function Tests:  Recent Labs Lab 04/29/13 1504  AST 21  ALT 17  ALKPHOS 125*  BILITOT 0.2*  PROT 6.7  ALBUMIN 3.0*   No results found for this basename: LIPASE, AMYLASE,  in the last 168 hours No results found for this basename: AMMONIA,  in the last 168 hours CBC:  Recent Labs Lab 04/29/13 1504 04/30/13 0455  WBC 6.1 8.1  NEUTROABS 4.7  --   HGB 13.8 13.2  HCT 40.2 38.9*  MCV 96.4 96.5  PLT 193 176   Cardiac Enzymes: No results found for this basename: CKTOTAL, CKMB, CKMBINDEX, TROPONINI,  in the last 168 hours BNP: BNP (last 3 results) No  results found for this basename: PROBNP,  in the last 8760 hours CBG: No results found for this basename: GLUCAP,  in the last  168 hours     Signed:  Kion Huntsberry A  [facility] 04/30/2013, 1:19 PM

## 2013-04-30 NOTE — Progress Notes (Signed)
Discharge instructions and education provided to caregiver at bedside. All questions answered. IV removed with tip intact. Heart monitor cleaned and returned to front. Levonne Spillerhasidy Kaelee Pfeffer, RN

## 2013-04-30 NOTE — Care Management Note (Signed)
    Page 1 of 1   04/30/2013     12:41:17 PM   CARE MANAGEMENT NOTE 04/30/2013  Patient:  [name] [name]   Account Number:  [id number]  Date Initiated:  04/30/2013  Documentation initiated by:  Donn PieriniWEBSTER,Ardella Chhim  Subjective/Objective Assessment:   Pt admitted with syncope     Action/Plan:   PTA pt lived at home with caregiver- PT eval ordered   Anticipated DC Date:  04/30/2013   Anticipated DC Plan:  HOME/SELF CARE  In-house referral  Clinical Social Worker      DC Planning Services  CM consult      Choice offered to / List presented to:             Status of service:  Completed, signed off Medicare Important Message given?   (If response is "NO", the following Medicare IM given date fields will be blank) Date Medicare IM given:   Date Additional Medicare IM given:    Discharge Disposition:  HOME/SELF CARE  Per UR Regulation:  Reviewed for med. necessity/level of care/duration of stay  If discussed at Long Length of Stay Meetings, dates discussed:    Comments:  04/30/13- 1215- Donn PieriniKristi Janashia Parco RN BSN [phone] Pt for d/c home today- referral for HH/DME needs- spoke with Ms. Nedra HaiLee at bedside pt's legal guardian- per conversation she states that pt has w/c at home- and PCS aide ([facility])  that comes 7days/week (6hrs/x2days, 5hrs/5days) - she reports that [facility] and is having pt's hospital bed changed over to [facility] (new bed is to be delivered this week) she is also interested in a small lift for home - will check with [facility] regarding this- 1230- spoke with F[facility]James at [facility]- and the only lift for home they have is the big hoyer lift, and they do not cover hand held showers. Informed Ms. Nedra HaiLee of this- she is not interested in the big hoyer lift- and will f/u with getting a new hand held shower head herself- she had decided she will have ambulance transportation home for pt as he has not been cooperative with staff this am and ambulance will be the safest way home- CSW  notified of need for ambulance transportation need. No further CM needs identified.

## 2013-04-30 NOTE — Progress Notes (Signed)
PT Cancellation Note  Patient Details Name: [name] [name] MRN: [id number] DOB: [birth year]   Cancelled Treatment:    Reason Eval/Treat Not Completed: PT screened, no needs identified, will sign off.  Per caregiver in room, pt is at his baseline level.  See also OT note for details.  Caregiver reports they have all equipment needed and aids help with exercising and ROM at home when pt is not resistant to mobility.  Caregiver's only concern is getting him home (and the expense of it if it is by ambulance).  RN CM made aware.  PT to sign off as there are no acute or home therapy needs at this time.  Thanks,    Rollene Rotundaebecca B. Daiki Dicostanzo, PT, DPT [phone]   04/30/2013, 10:30 AM

## 2013-05-28 ENCOUNTER — Encounter: Payer: Self-pay | Admitting: Podiatry

## 2013-05-28 ENCOUNTER — Ambulatory Visit: Payer: Medicaid Other | Admitting: Podiatry

## 2013-05-28 ENCOUNTER — Ambulatory Visit (INDEPENDENT_AMBULATORY_CARE_PROVIDER_SITE_OTHER): Payer: Medicare HMO | Admitting: Podiatry

## 2013-05-28 DIAGNOSIS — B351 Tinea unguium: Secondary | ICD-10-CM

## 2013-05-28 DIAGNOSIS — M79609 Pain in unspecified limb: Secondary | ICD-10-CM

## 2013-05-28 NOTE — Progress Notes (Signed)
Subjective:     Patient ID: [name] [name], male   DOB: 05-26-1919, 80 y.o.   MRN: [id number]  HPI patient presents with thick dystrophic nailbeds 1-5 both feet that can become painful at times   Review of Systems     Objective:   Physical Exam Neurovascular status intact with thick nailbeds 1-5 both feet that are moderately tender    Assessment:     Mycotic nail infection with pain 1-5 both feet    Plan:     Debridement painful nailbeds 1-5 both feet with no iatrogenic bleeding noted

## 2013-08-18 ENCOUNTER — Encounter (HOSPITAL_COMMUNITY): Payer: Self-pay | Admitting: Emergency Medicine

## 2013-08-18 ENCOUNTER — Observation Stay (HOSPITAL_COMMUNITY)
Admission: EM | Admit: 2013-08-18 | Discharge: 2013-08-25 | Disposition: E | Payer: PRIVATE HEALTH INSURANCE | Attending: Internal Medicine | Admitting: Internal Medicine

## 2013-08-18 DIAGNOSIS — Z87891 Personal history of nicotine dependence: Secondary | ICD-10-CM | POA: Diagnosis not present

## 2013-08-18 DIAGNOSIS — N179 Acute kidney failure, unspecified: Principal | ICD-10-CM

## 2013-08-18 DIAGNOSIS — Z8673 Personal history of transient ischemic attack (TIA), and cerebral infarction without residual deficits: Secondary | ICD-10-CM | POA: Insufficient documentation

## 2013-08-18 DIAGNOSIS — Z7902 Long term (current) use of antithrombotics/antiplatelets: Secondary | ICD-10-CM | POA: Diagnosis not present

## 2013-08-18 DIAGNOSIS — Z79899 Other long term (current) drug therapy: Secondary | ICD-10-CM | POA: Diagnosis not present

## 2013-08-18 DIAGNOSIS — Z7982 Long term (current) use of aspirin: Secondary | ICD-10-CM | POA: Insufficient documentation

## 2013-08-18 DIAGNOSIS — IMO0002 Reserved for concepts with insufficient information to code with codable children: Secondary | ICD-10-CM | POA: Insufficient documentation

## 2013-08-18 DIAGNOSIS — E860 Dehydration: Secondary | ICD-10-CM

## 2013-08-18 DIAGNOSIS — F0280 Dementia in other diseases classified elsewhere without behavioral disturbance: Secondary | ICD-10-CM | POA: Insufficient documentation

## 2013-08-18 DIAGNOSIS — E870 Hyperosmolality and hypernatremia: Secondary | ICD-10-CM | POA: Diagnosis not present

## 2013-08-18 DIAGNOSIS — N183 Chronic kidney disease, stage 3 unspecified: Secondary | ICD-10-CM | POA: Diagnosis not present

## 2013-08-18 DIAGNOSIS — Z66 Do not resuscitate: Secondary | ICD-10-CM | POA: Insufficient documentation

## 2013-08-18 DIAGNOSIS — N390 Urinary tract infection, site not specified: Secondary | ICD-10-CM

## 2013-08-18 DIAGNOSIS — N189 Chronic kidney disease, unspecified: Secondary | ICD-10-CM

## 2013-08-18 DIAGNOSIS — R32 Unspecified urinary incontinence: Secondary | ICD-10-CM | POA: Insufficient documentation

## 2013-08-18 DIAGNOSIS — G309 Alzheimer's disease, unspecified: Secondary | ICD-10-CM | POA: Diagnosis not present

## 2013-08-18 DIAGNOSIS — G9340 Encephalopathy, unspecified: Secondary | ICD-10-CM | POA: Insufficient documentation

## 2013-08-18 DIAGNOSIS — E878 Other disorders of electrolyte and fluid balance, not elsewhere classified: Secondary | ICD-10-CM

## 2013-08-18 DIAGNOSIS — R627 Adult failure to thrive: Secondary | ICD-10-CM | POA: Diagnosis present

## 2013-08-18 DIAGNOSIS — I129 Hypertensive chronic kidney disease with stage 1 through stage 4 chronic kidney disease, or unspecified chronic kidney disease: Secondary | ICD-10-CM | POA: Insufficient documentation

## 2013-08-18 HISTORY — DX: Cerebral infarction, unspecified: I63.9

## 2013-08-18 HISTORY — DX: Transient cerebral ischemic attack, unspecified: G45.9

## 2013-08-18 LAB — URINALYSIS, ROUTINE W REFLEX MICROSCOPIC
BILIRUBIN URINE: NEGATIVE
GLUCOSE, UA: NEGATIVE mg/dL
KETONES UR: NEGATIVE mg/dL
Nitrite: NEGATIVE
Protein, ur: 30 mg/dL — AB
Specific Gravity, Urine: 1.019 (ref 1.005–1.030)
UROBILINOGEN UA: 1 mg/dL (ref 0.0–1.0)
pH: 5 (ref 5.0–8.0)

## 2013-08-18 LAB — URINE MICROSCOPIC-ADD ON

## 2013-08-18 LAB — CBC WITH DIFFERENTIAL/PLATELET
Basophils Absolute: 0 10*3/uL (ref 0.0–0.1)
Basophils Relative: 0 % (ref 0–1)
EOS ABS: 0.3 10*3/uL (ref 0.0–0.7)
Eosinophils Relative: 3 % (ref 0–5)
HEMATOCRIT: 44.2 % (ref 39.0–52.0)
HEMOGLOBIN: 14.9 g/dL (ref 13.0–17.0)
Lymphocytes Relative: 23 % (ref 12–46)
Lymphs Abs: 1.9 10*3/uL (ref 0.7–4.0)
MCH: 32.8 pg (ref 26.0–34.0)
MCHC: 33.7 g/dL (ref 30.0–36.0)
MCV: 97.4 fL (ref 78.0–100.0)
MONOS PCT: 9 % (ref 3–12)
Monocytes Absolute: 0.7 10*3/uL (ref 0.1–1.0)
NEUTROS ABS: 5.3 10*3/uL (ref 1.7–7.7)
Neutrophils Relative %: 65 % (ref 43–77)
Platelets: 187 10*3/uL (ref 150–400)
RBC: 4.54 MIL/uL (ref 4.22–5.81)
RDW: 14.7 % (ref 11.5–15.5)
WBC: 8.1 10*3/uL (ref 4.0–10.5)

## 2013-08-18 LAB — BASIC METABOLIC PANEL
Anion gap: 16 — ABNORMAL HIGH (ref 5–15)
BUN: 47 mg/dL — ABNORMAL HIGH (ref 6–23)
CHLORIDE: 117 meq/L — AB (ref 96–112)
CO2: 21 mEq/L (ref 19–32)
Calcium: 9.4 mg/dL (ref 8.4–10.5)
Creatinine, Ser: 3.13 mg/dL — ABNORMAL HIGH (ref 0.50–1.35)
GFR calc Af Amer: 19 mL/min — ABNORMAL LOW (ref >90)
GFR calc non Af Amer: 16 mL/min — ABNORMAL LOW (ref >90)
Glucose, Bld: 119 mg/dL — ABNORMAL HIGH (ref 70–99)
Potassium: 4.5 mEq/L (ref 3.7–5.3)
Sodium: 154 mEq/L — ABNORMAL HIGH (ref 137–147)

## 2013-08-18 MED ORDER — ALBUTEROL SULFATE (2.5 MG/3ML) 0.083% IN NEBU: 2.5000 mg | INHALATION_SOLUTION | Freq: Four times a day (QID) | RESPIRATORY_TRACT | Status: DC | PRN

## 2013-08-18 MED ORDER — ISOSORBIDE MONONITRATE ER 30 MG PO TB24
30.0000 mg | ORAL_TABLET | Freq: Every day | ORAL | Status: DC
  Filled 2013-08-18: qty 1

## 2013-08-18 MED ORDER — NORTRIPTYLINE HCL 25 MG PO CAPS
75.0000 mg | ORAL_CAPSULE | Freq: Every day | ORAL | Status: DC
  Administered 2013-08-19: 75 mg via ORAL
  Filled 2013-08-18 (×2): qty 3

## 2013-08-18 MED ORDER — SODIUM CHLORIDE 0.9 % IV SOLN
INTRAVENOUS | Status: DC
  Administered 2013-08-19: via INTRAVENOUS

## 2013-08-18 MED ORDER — METOPROLOL SUCCINATE 12.5 MG HALF TABLET
12.5000 mg | ORAL_TABLET | Freq: Every evening | ORAL | Status: DC
  Administered 2013-08-19: 12.5 mg via ORAL
  Filled 2013-08-18 (×2): qty 1

## 2013-08-18 MED ORDER — LEVOTHYROXINE SODIUM 75 MCG PO TABS
75.0000 ug | ORAL_TABLET | Freq: Every day | ORAL | Status: DC
  Filled 2013-08-18: qty 1

## 2013-08-18 MED ORDER — FINASTERIDE 5 MG PO TABS
5.0000 mg | ORAL_TABLET | Freq: Every day | ORAL | Status: DC
  Filled 2013-08-18: qty 1

## 2013-08-18 MED ORDER — ASPIRIN EC 81 MG PO TBEC
81.0000 mg | DELAYED_RELEASE_TABLET | Freq: Every day | ORAL | Status: DC
  Filled 2013-08-18: qty 1

## 2013-08-18 MED ORDER — CLOPIDOGREL BISULFATE 75 MG PO TABS: 75.0000 mg | ORAL_TABLET | ORAL | Status: DC

## 2013-08-18 MED ORDER — PANTOPRAZOLE SODIUM 40 MG PO TBEC: 40.0000 mg | DELAYED_RELEASE_TABLET | Freq: Every day | ORAL | Status: DC

## 2013-08-18 MED ORDER — NITROGLYCERIN 0.4 MG SL SUBL
0.4000 mg | SUBLINGUAL_TABLET | SUBLINGUAL | Status: DC | PRN
Start: 2013-08-18 — End: 2013-08-19

## 2013-08-18 MED ORDER — CLOPIDOGREL BISULFATE 75 MG PO TABS
75.0000 mg | ORAL_TABLET | ORAL | Status: DC
  Filled 2013-08-18: qty 1

## 2013-08-18 MED ORDER — DEXTROSE 5 % IV SOLN
1.0000 g | INTRAVENOUS | Status: DC
  Filled 2013-08-18: qty 10

## 2013-08-18 MED ORDER — ALBUTEROL SULFATE HFA 108 (90 BASE) MCG/ACT IN AERS: 2.0000 | INHALATION_SPRAY | Freq: Four times a day (QID) | RESPIRATORY_TRACT | Status: DC | PRN

## 2013-08-18 MED ORDER — LOTEPREDNOL-TOBRAMYCIN 0.5-0.3 % OP SUSP: 1.0000 [drp] | Freq: Four times a day (QID) | OPHTHALMIC | Status: DC

## 2013-08-18 MED ORDER — DEXTROSE 5 % IV SOLN
1.0000 g | Freq: Once | INTRAVENOUS | Status: AC
  Administered 2013-08-18: 1 g via INTRAVENOUS
  Filled 2013-08-18: qty 10

## 2013-08-18 MED ORDER — HEPARIN SODIUM (PORCINE) 5000 UNIT/ML IJ SOLN
5000.0000 [IU] | Freq: Three times a day (TID) | INTRAMUSCULAR | Status: DC
  Filled 2013-08-18 (×3): qty 1

## 2013-08-18 MED ORDER — MEMANTINE HCL 10 MG PO TABS
10.0000 mg | ORAL_TABLET | Freq: Two times a day (BID) | ORAL | Status: DC
  Administered 2013-08-19: 10 mg via ORAL
  Filled 2013-08-18 (×3): qty 1

## 2013-08-18 MED ORDER — SODIUM CHLORIDE 0.9 % IV BOLUS (SEPSIS)
1000.0000 mL | Freq: Once | INTRAVENOUS | Status: AC
  Administered 2013-08-18: 1000 mL via INTRAVENOUS

## 2013-08-18 MED ORDER — TAMSULOSIN HCL 0.4 MG PO CAPS
0.4000 mg | ORAL_CAPSULE | Freq: Every day | ORAL | Status: DC
  Filled 2013-08-18: qty 1

## 2013-08-18 MED ORDER — AMIODARONE HCL 100 MG PO TABS
100.0000 mg | ORAL_TABLET | Freq: Every day | ORAL | Status: DC
  Filled 2013-08-18: qty 1

## 2013-08-18 MED ORDER — TOBRAMYCIN-DEXAMETHASONE 0.3-0.1 % OP SUSP
1.0000 [drp] | Freq: Three times a day (TID) | OPHTHALMIC | Status: DC
  Filled 2013-08-18: qty 2.5

## 2013-08-18 NOTE — ED Notes (Signed)
Missed attempts at IV starts by 2 RN's.

## 2013-08-18 NOTE — ED Notes (Signed)
Per EMS, pt comes from home. Caretaker reports pt has not been eating or drinking Ensure. Caregiver states patient maybe dehydrated. Pt incontinent. Pt alert to self and birthdate. Pt at baseline per caregiver No LOC. BP 131/78 P 89 cbg 111.

## 2013-08-18 NOTE — H&P (Signed)
Hospitalist Admission History and Physical  Patient name: [name] [name] Medical record number: [id number] Date of birth: [birth year] Age: 81 y.o. Gender: male  Primary Care Provider: Dorrene German, MD  Chief Complaint: dehydration, UTI, hypernatremia   History of Present Illness:This is a 80 y.o. year old male with significant past medical history of dementia, stage 3 CKD, HTN  presenting with dehydration, UTI, hypernatremia. The patient's caregivers at the bedside. Care to reports the patient has had decreased by mouth intake over the past 2 days. Denies any fevers or chills. No recent falls. Says he's also had decreased urinary output.Presented to the ER because of persistent symptoms. Hemodynamically stable on presentation. Afebrile. Satting > 97% on room air. White blood cell count 8.1. urinalysis indicative of infection. Patient is started on Rocephin. Start on IV fluids as well.  Assessment and Plan: [name] [name] is a 80 y.o. year old male presenting with dehydration, UTI, hypernatremia   Active Problems:   UTI (lower urinary tract infection)   UTI: Rocephin. Pan cutlure. Continue to follow.   Dehydration: clinically dry on exam. Hydrate pt. Continue to follow.   Hypernatremia: likely secondary to above. Check urine osm. Hydrate pt. Trend.   Dementia: Caregiver reports pt at/near baseline. Minimal confusion today. Discussed with caregiver treatment for above- she is agreeable to this. May escalate workup if pt acutely decompensates or fails to improve despite treatment.   HTN: stable on presentation. Continue home regimen.   CKD: mildly above baseline in setting of dehyration. Hydrate and reasess.   FEN/GI: NPO for now. Bedside swallow eval. Consider MRI +/- barium swallow if negative.   Prophylaxis: sub q heparin Disposition: pending further evaluation  Code Status:DNR    Patient Active Problem List   Diagnosis Date Noted  . UTI (lower urinary tract infection)  07/27/2013  . Syncope 04/29/2013  . CKD (chronic kidney disease), stage III 04/29/2013  . Acute on chronic renal failure 10/01/2012  . Encephalopathy 10/01/2012  . TIA (transient ischemic attack) 10/01/2012  . Alzheimer's disease 08/12/2010  . Depression 08/12/2010  . History of BPH 08/12/2010  . Arthritis 08/12/2010  . Poor circulation 08/12/2010   Past Medical History: Past Medical History  Diagnosis Date  . Alzheimer's dementia   . Dementia   . Asthma   . Sinus congestion   . Hypercholesteremia   . Hypertension   . Depression   . Enlarged prostate   . GERD (gastroesophageal reflux disease)   . Hypothyroidism   . History of blood transfusion     "related to bleeding from rectum" (04/30/2013)  . Lower GI bleed   . Arthritis     "legs, hands, right foot" (04/30/2013)  . Anemia   . Gout     "years ago" (04/30/2013)  . Renal disorder     renal lithiasis  . Stroke   . TIA (transient ischemic attack)     Past Surgical History: Past Surgical History  Procedure Laterality Date  . Lithotripsy    . Eye surgery    . Cystectomy Right     "side of his neck"  . Cataract extraction w/ intraocular lens  implant, bilateral Bilateral     Social History: History   Social History  . Marital Status: Married    Spouse Name: N/A    Number of Children: N/A  . Years of Education: N/A   Social History Main Topics  . Smoking status: Former Smoker    Types: Cigarettes  . Smokeless tobacco: Former Neurosurgeon  Types: Chew     Comment: 04/30/2013 "quit smoking cigarettes in the late 1990's"  . Alcohol Use: Yes     Comment: 04/30/2013 "no alcohol for years"  . Drug Use: No  . Sexual Activity: No   Other Topics Concern  . None   Social History Narrative  . None    Family History: History reviewed. No pertinent family history.  Allergies: No Known Allergies  Current Facility-Administered Medications  Medication Dose Route Frequency Provider Last Rate Last Dose  . 0.9 %  sodium  chloride infusion   Intravenous Continuous Doree AlbeeSteven Tung Pustejovsky, MD      . albuterol (PROVENTIL HFA;VENTOLIN HFA) 108 (90 BASE) MCG/ACT inhaler 2 puff  2 puff Inhalation Q6H PRN Doree AlbeeSteven Becker Christopher, MD      . amiodarone (PACERONE) tablet 100 mg  100 mg Oral Daily Doree AlbeeSteven Naiya Corral, MD      . aspirin EC tablet 81 mg  81 mg Oral Daily Doree AlbeeSteven Vamsi Apfel, MD      . cefTRIAXone (ROCEPHIN) 1 g in dextrose 5 % 50 mL IVPB  1 g Intravenous Once Roxy Horsemanobert Browning, PA-C      . cefTRIAXone (ROCEPHIN) 1 g in dextrose 5 % 50 mL IVPB  1 g Intravenous Q24H Doree AlbeeSteven Callan Yontz, MD      . clopidogrel (PLAVIX) tablet 75 mg  75 mg Oral QODAY Doree AlbeeSteven Amery Minasyan, MD      . finasteride (PROSCAR) tablet 5 mg  5 mg Oral Daily Doree AlbeeSteven Allister Lessley, MD      . heparin injection 5,000 Units  5,000 Units Subcutaneous 3 times per day Doree AlbeeSteven Aycen Porreca, MD      . isosorbide mononitrate (IMDUR) 24 hr tablet 30 mg  30 mg Oral Daily Doree AlbeeSteven Luanne Krzyzanowski, MD      . Melene Muller[START ON 08/05/2013] levothyroxine (SYNTHROID, LEVOTHROID) tablet 75 mcg  75 mcg Oral QAC breakfast Doree AlbeeSteven Uilani Sanville, MD      . Karma LewLoteprednol-Tobramycin 0.5-0.3 % SUSP 1 drop  1 drop Right Eye QID Doree AlbeeSteven Christee Mervine, MD      . memantine [facility](NAMENDA) tablet 10 mg  10 mg Oral BID Doree AlbeeSteven Deke Tilghman, MD      . metoprolol succinate (TOPROL-XL) 24 hr tablet 12.5 mg  12.5 mg Oral QPM Doree AlbeeSteven Gracen Ringwald, MD      . nitroGLYCERIN (NITROSTAT) SL tablet 0.4 mg  0.4 mg Sublingual Q5 min PRN Doree AlbeeSteven Cinda Hara, MD      . nortriptyline (PAMELOR) capsule 75 mg  75 mg Oral QHS Doree AlbeeSteven Maximiliano Cromartie, MD      . pantoprazole (PROTONIX) EC tablet 40 mg  40 mg Oral Daily Doree AlbeeSteven Daielle Melcher, MD      . tamsulosin (FLOMAX) capsule 0.4 mg  0.4 mg Oral Daily Doree AlbeeSteven Saprina Chuong, MD       Current Outpatient Prescriptions  Medication Sig Dispense Refill  . albuterol (PROVENTIL HFA;VENTOLIN HFA) 108 (90 BASE) MCG/ACT inhaler Inhale 2 puffs into the lungs every 6 (six) hours as needed for wheezing or shortness of breath.      Marland Kitchen. amiodarone (PACERONE) 200 MG tablet Take 100 mg by mouth daily.       Marland Kitchen. aspirin EC 81 MG tablet Take 81 mg by mouth daily.      . carbamide peroxide (DEBROX) 6.5 % otic solution Place 5 drops into both ears 2 (two) times daily.      . cetirizine (ZYRTEC) 10 MG tablet Take 10 mg by mouth daily.      . clopidogrel (PLAVIX) 75 MG tablet Take 75 mg by mouth every other day.       .Marland Kitchen  ergocalciferol (VITAMIN D2) 50000 UNITS capsule Take 50,000 Units by mouth once a week. Tuesday      . esomeprazole (NEXIUM) 40 MG capsule Take 40 mg by mouth daily before breakfast.      . finasteride (PROSCAR) 5 MG tablet Take 5 mg by mouth daily.        . fluticasone (FLONASE) 50 MCG/ACT nasal spray Place 2 sprays into the nose daily as needed for rhinitis.      Marland Kitchen isosorbide mononitrate (IMDUR) 30 MG 24 hr tablet Take 30 mg by mouth daily.      Marland Kitchen levothyroxine (SYNTHROID, LEVOTHROID) 75 MCG tablet Take 75 mcg by mouth daily before breakfast.      . Loteprednol-Tobramycin (ZYLET) 0.5-0.3 % SUSP Place 1 drop into the right eye 4 (four) times daily.      . memantine (NAMENDA) 10 MG tablet Take 10 mg by mouth 2 (two) times daily.        . metoprolol succinate (TOPROL-XL) 25 MG 24 hr tablet Take 12.5 mg by mouth every evening.      . nitroGLYCERIN (NITROSTAT) 0.4 MG SL tablet Place 0.4 mg under the tongue every 5 (five) minutes as needed for chest pain.      . nortriptyline (PAMELOR) 75 MG capsule Take 75 mg by mouth at bedtime.      . tamsulosin (FLOMAX) 0.4 MG CAPS capsule Take 0.4 mg by mouth daily.      . Hydrocodone-Acetaminophen (VICODIN) 5-300 MG TABS Take 1 tablet by mouth daily as needed (pain).       Review Of Systems: 12 point ROS negative except as noted above in HPI.  Physical Exam: Filed Vitals:   08/07/2013 2030  BP: 129/72  Pulse:   Temp:   Resp: 21    General: confused minimally cooperative to exam HEENT: extra ocular movement intact and dry oral mucosa Heart: S1, S2 normal, no murmur, rub or gallop, regular rate and rhythm Lungs: clear to auscultation, no wheezes  or rales and unlabored breathing Abdomen: abdomen is soft without significant tenderness, masses, organomegaly or guarding Extremities: extremities normal, atraumatic, no cyanosis or edema Skin:dry skin diffusely Neurology: minimally cooperative to exam, no focal findings noted.   Labs and Imaging: Lab Results  Component Value Date/Time   NA 154* 08/01/2013  7:22 PM   K 4.5 07/27/2013  7:22 PM   CL 117* 08/05/2013  7:22 PM   CO2 21 08/15/2013  7:22 PM   BUN 47* 08/11/2013  7:22 PM   CREATININE 3.13* 08/12/2013  7:22 PM   GLUCOSE 119* 07/26/2013  7:22 PM   Lab Results  Component Value Date   WBC 8.1 08/17/2013   HGB 14.9 08/17/2013   HCT 44.2 08/10/2013   MCV 97.4 08/13/2013   PLT 187 08/18/2013    No results found.         Doree Albee MD  Pager: [phone]

## 2013-08-18 NOTE — ED Provider Notes (Signed)
CSN: [id number]     Arrival date & time 07/28/2013  1808 History   First MD Initiated Contact with Patient 07/25/2013 1818     Chief Complaint  Patient presents with  . Failure To Thrive     (Consider location/radiation/quality/duration/timing/severity/associated sxs/prior Treatment) HPI Comments: Patient presents to the emergency department with chief complaint of flow to thrive. He is brought in by his caregiver, who he lives with. Patient has a history of dementia. History is limited secondary to dementia, and is provided by the caregiver. Caregiver states that for the past 2-3 days, he has been drinking and eating much less than normal. She denies any fevers, chills, nausea, vomiting, diarrhea, constipation. She states the patient is incontinent for urine, and this is been unchanged for the past 3 years. She is tried feeding the patient Ensure and water, which she has not been drinking. Patient is DO NOT RESUSCITATE.  The history is provided by the patient. No language interpreter was used.    Past Medical History  Diagnosis Date  . Alzheimer's dementia   . Dementia   . Asthma   . Sinus congestion   . Hypercholesteremia   . Hypertension   . Depression   . Enlarged prostate   . GERD (gastroesophageal reflux disease)   . Hypothyroidism   . History of blood transfusion     "related to bleeding from rectum" (04/30/2013)  . Lower GI bleed   . Arthritis     "legs, hands, right foot" (04/30/2013)  . Anemia   . Gout     "years ago" (04/30/2013)  . Renal disorder     renal lithiasis  . Stroke   . TIA (transient ischemic attack)    Past Surgical History  Procedure Laterality Date  . Lithotripsy    . Eye surgery    . Cystectomy Right     "side of his neck"  . Cataract extraction w/ intraocular lens  implant, bilateral Bilateral    History reviewed. No pertinent family history. History  Substance Use Topics  . Smoking status: Former Smoker    Types: Cigarettes  . Smokeless tobacco:  Former User    Types: Chew     Comment: 04/30/2013 "quit smoking cigarettes in the late 1990's"  . Alcohol Use: Yes     Comment: 04/30/2013 "no alcohol for years"    Review of Systems  All other systems reviewed and are negative.     Allergies  Review of patient's allergies indicates no known allergies.  Home Medications   Prior to Admission medications   Medication Sig Start Date End Date Taking? Authorizing Provider  albuterol (PROVENTIL HFA;VENTOLIN HFA) 108 (90 BASE) MCG/ACT inhaler Inhale 2 puffs into the lungs every 6 (six) hours as needed for wheezing or shortness of breath.   Yes Historical Provider, MD  amiodarone (PACERONE) 200 MG tablet Take 100 mg by mouth daily.   Yes Historical Provider, MD  aspirin EC 81 MG tablet Take 81 mg by mouth daily.   Yes Historical Provider, MD  carbamide peroxide (DEBROX) 6.5 % otic solution Place 5 drops into both ears 2 (two) times daily.   Yes Historical Provider, MD  cetirizine (ZYRTEC) 10 MG tablet Take 10 mg by mouth daily.   Yes Historical Provider, MD  clopidogrel (PLAVIX) 75 MG tablet Take 75 mg by mouth every other day.    Yes Historical Provider, MD  ergocalciferol (VITAMIN D2) 50000 UNITS capsule Take 50,000 Units by mouth once a week. Tuesday  Yes Historical Provider, MD  esomeprazole (NEXIUM) 40 MG capsule Take 40 mg by mouth daily before breakfast.   Yes Historical Provider, MD  finasteride (PROSCAR) 5 MG tablet Take 5 mg by mouth daily.     Yes Historical Provider, MD  fluticasone (FLONASE) 50 MCG/ACT nasal spray Place 2 sprays into the nose daily as needed for rhinitis.   Yes Historical Provider, MD  isosorbide mononitrate (IMDUR) 30 MG 24 hr tablet Take 30 mg by mouth daily.   Yes Historical Provider, MD  levothyroxine (SYNTHROID, LEVOTHROID) 75 MCG tablet Take 75 mcg by mouth daily before breakfast.   Yes Historical Provider, MD  Loteprednol-Tobramycin (ZYLET) 0.5-0.3 % SUSP Place 1 drop into the right eye 4 (four) times daily.    Yes Historical Provider, MD  memantine (NAMENDA) 10 MG tablet Take 10 mg by mouth 2 (two) times daily.     Yes Historical Provider, MD  metoprolol succinate (TOPROL-XL) 25 MG 24 hr tablet Take 12.5 mg by mouth every evening.   Yes Historical Provider, MD  nitroGLYCERIN (NITROSTAT) 0.4 MG SL tablet Place 0.4 mg under the tongue every 5 (five) minutes as needed for chest pain.   Yes Historical Provider, MD  nortriptyline (PAMELOR) 75 MG capsule Take 75 mg by mouth at bedtime.   Yes Historical Provider, MD  tamsulosin (FLOMAX) 0.4 MG CAPS capsule Take 0.4 mg by mouth daily.   Yes Historical Provider, MD  Hydrocodone-Acetaminophen (VICODIN) 5-300 MG TABS Take 1 tablet by mouth daily as needed (pain).    Historical Provider, MD   BP 128/72  Pulse 83  Temp(Src) 97.5 F (36.4 C) (Oral)  Resp 21  Ht 5\' 4"  (1.626 m)  Wt 212 lb (96.163 kg)  BMI 36.37 kg/m2  SpO2 99% Physical Exam  Nursing note and vitals reviewed. Constitutional: He is oriented to person, place, and time. He appears well-developed and well-nourished.  HENT:  Head: Normocephalic and atraumatic.  Eyes: Conjunctivae and EOM are normal. Pupils are equal, round, and reactive to light. Right eye exhibits no discharge. Left eye exhibits no discharge. No scleral icterus.  Neck: Normal range of motion. Neck supple. No JVD present.  Cardiovascular: Normal rate, regular rhythm and normal heart sounds.  Exam reveals no gallop and no friction rub.   No murmur heard. Pulmonary/Chest: Effort normal and breath sounds normal. No respiratory distress. He has no wheezes. He has no rales. He exhibits no tenderness.  Abdominal: Soft. He exhibits no distension and no mass. There is no tenderness. There is no rebound and no guarding.  Musculoskeletal: Normal range of motion. He exhibits no edema and no tenderness.  Neurological: He is alert and oriented to person, place, and time.  Skin: Skin is warm and dry.  Psychiatric: He has a normal mood and  affect. His behavior is normal. Judgment and thought content normal.    ED Course  Procedures (including critical care time) Results for orders placed during the hospital encounter of September 03, 2013  CBC WITH DIFFERENTIAL      Result Value Ref Range   WBC 8.1  4.0 - 10.5 K/uL   RBC 4.54  4.22 - 5.81 MIL/uL   Hemoglobin 14.9  13.0 - 17.0 g/dL   HCT 16.1  09.6 - 04.5 %   MCV 97.4  78.0 - 100.0 fL   MCH 32.8  26.0 - 34.0 pg   MCHC 33.7  30.0 - 36.0 g/dL   RDW 40.9  81.1 - 91.4 %   Platelets 187  150 -  400 K/uL   Neutrophils Relative % 65  43 - 77 %   Neutro Abs 5.3  1.7 - 7.7 K/uL   Lymphocytes Relative 23  12 - 46 %   Lymphs Abs 1.9  0.7 - 4.0 K/uL   Monocytes Relative 9  3 - 12 %   Monocytes Absolute 0.7  0.1 - 1.0 K/uL   Eosinophils Relative 3  0 - 5 %   Eosinophils Absolute 0.3  0.0 - 0.7 K/uL   Basophils Relative 0  0 - 1 %   Basophils Absolute 0.0  0.0 - 0.1 K/uL  BASIC METABOLIC PANEL      Result Value Ref Range   Sodium 154 (*) 137 - 147 mEq/L   Potassium 4.5  3.7 - 5.3 mEq/L   Chloride 117 (*) 96 - 112 mEq/L   CO2 21  19 - 32 mEq/L   Glucose, Bld 119 (*) 70 - 99 mg/dL   BUN 47 (*) 6 - 23 mg/dL   Creatinine, Ser 4.543.13 (*) 0.50 - 1.35 mg/dL   Calcium 9.4  8.4 - 09.810.5 mg/dL   GFR calc non Af Amer 16 (*) >90 mL/min   GFR calc Af Amer 19 (*) >90 mL/min   Anion gap 16 (*) 5 - 15  URINALYSIS, ROUTINE W REFLEX MICROSCOPIC      Result Value Ref Range   Color, Urine YELLOW  YELLOW   APPearance TURBID (*) CLEAR   Specific Gravity, Urine 1.019  1.005 - 1.030   pH 5.0  5.0 - 8.0   Glucose, UA NEGATIVE  NEGATIVE mg/dL   Hgb urine dipstick MODERATE (*) NEGATIVE   Bilirubin Urine NEGATIVE  NEGATIVE   Ketones, ur NEGATIVE  NEGATIVE mg/dL   Protein, ur 30 (*) NEGATIVE mg/dL   Urobilinogen, UA 1.0  0.0 - 1.0 mg/dL   Nitrite NEGATIVE  NEGATIVE   Leukocytes, UA LARGE (*) NEGATIVE  URINE MICROSCOPIC-ADD ON      Result Value Ref Range   WBC, UA 21-50  <3 WBC/hpf   RBC / HPF 21-50  <3  RBC/hpf   Urine-Other AMORPHOUS URATES/PHOSPHATES     No results found.    EKG Interpretation None      MDM   Final diagnoses:  UTI (lower urinary tract infection)  Hypernatremia  Hyperchloremia  Dehydration    Patient was reported decreased oral intake. Baseline dementia. Will check labs, and urinalysis.  Patient is hypernatremic, and hyperchloremic, and also has a UTI. Will admit to medicine. Will give fluids. Will give Rocephin. Patient discussed with [facility], who will admit.    Roxy Horsemanobert Gerica Koble, PA-C 2013/12/29 2100

## 2013-08-18 NOTE — ED Notes (Signed)
Transporting patient to new room assignment. 

## 2013-08-18 NOTE — ED Provider Notes (Signed)
Medical screening examination/treatment/procedure(s) were performed by non-physician practitioner and as supervising physician I was immediately available for consultation/collaboration.   EKG Interpretation None        Keairra Bardon, MD 07/28/2013 2345 

## 2013-08-19 DIAGNOSIS — E870 Hyperosmolality and hypernatremia: Secondary | ICD-10-CM | POA: Diagnosis present

## 2013-08-19 DIAGNOSIS — N179 Acute kidney failure, unspecified: Secondary | ICD-10-CM | POA: Diagnosis not present

## 2013-08-19 DIAGNOSIS — N189 Chronic kidney disease, unspecified: Secondary | ICD-10-CM

## 2013-08-19 DIAGNOSIS — N390 Urinary tract infection, site not specified: Secondary | ICD-10-CM

## 2013-08-19 LAB — CBC WITH DIFFERENTIAL/PLATELET
BASOS PCT: 0 % (ref 0–1)
Basophils Absolute: 0 10*3/uL (ref 0.0–0.1)
EOS ABS: 0.2 10*3/uL (ref 0.0–0.7)
EOS PCT: 2 % (ref 0–5)
HCT: 30.3 % — ABNORMAL LOW (ref 39.0–52.0)
HEMOGLOBIN: 9.8 g/dL — AB (ref 13.0–17.0)
LYMPHS PCT: 43 % (ref 12–46)
Lymphs Abs: 3.5 10*3/uL (ref 0.7–4.0)
MCH: 33 pg (ref 26.0–34.0)
MCHC: 32.3 g/dL (ref 30.0–36.0)
MCV: 102 fL — ABNORMAL HIGH (ref 78.0–100.0)
Monocytes Absolute: 0.6 10*3/uL (ref 0.1–1.0)
Monocytes Relative: 7 % (ref 3–12)
NEUTROS ABS: 3.9 10*3/uL (ref 1.7–7.7)
NEUTROS PCT: 48 % (ref 43–77)
Platelets: 188 10*3/uL (ref 150–400)
RBC: 2.97 MIL/uL — AB (ref 4.22–5.81)
RDW: 15.1 % (ref 11.5–15.5)
WBC: 8.2 10*3/uL (ref 4.0–10.5)

## 2013-08-19 LAB — BASIC METABOLIC PANEL
ANION GAP: 16 — AB (ref 5–15)
BUN: 44 mg/dL — AB (ref 6–23)
CHLORIDE: 123 meq/L — AB (ref 96–112)
CO2: 17 mEq/L — ABNORMAL LOW (ref 19–32)
Calcium: 8.8 mg/dL (ref 8.4–10.5)
Creatinine, Ser: 2.99 mg/dL — ABNORMAL HIGH (ref 0.50–1.35)
GFR calc Af Amer: 20 mL/min — ABNORMAL LOW (ref >90)
GFR calc non Af Amer: 17 mL/min — ABNORMAL LOW (ref >90)
Glucose, Bld: 97 mg/dL (ref 70–99)
POTASSIUM: 4.8 meq/L (ref 3.7–5.3)
Sodium: 156 mEq/L — ABNORMAL HIGH (ref 137–147)

## 2013-08-19 LAB — OSMOLALITY, URINE: OSMOLALITY UR: 653 mosm/kg (ref 390–1090)

## 2013-08-19 MED ORDER — SODIUM CHLORIDE 0.45 % IV SOLN: INTRAVENOUS | Status: DC

## 2013-08-21 LAB — CULTURE, BLOOD (ROUTINE X 2)

## 2013-08-22 LAB — URINE CULTURE

## 2013-08-25 LAB — CULTURE, BLOOD (ROUTINE X 2): Culture: NO GROWTH

## 2013-08-25 NOTE — Progress Notes (Signed)
At 0550 pt. Found breathless, pulseless, and pronounced deceased by Jarrett Ablesachel Tressie Ragin RN and Arbutus LeasSondra Tilman RN.  Family at bedside.  Physician notified. Reference #  [id number].  Hurshel Partyim Shore said ok to release body.

## 2013-08-25 NOTE — Discharge Summary (Signed)
Death Summary  Eber JonesVirnel Coll ZOX:[id number] DOB: 1919-07-31 DOA: 07/30/2013  PCP: Dorrene GermanAVBUERE,EDWIN A, MD PCP/Office notified: no  Admit date: 08/14/2013 Date of Death: December 30, 2013  Final Diagnoses:  Active Problems:   Acute on chronic renal failure   Encephalopathy   CKD (chronic kidney disease), stage III   UTI (lower urinary tract infection)   Hypernatremia   History of present illness:  80 y.o. year old male with significant past medical history of dementia, stage 3 CKD, HTN presenting with dehydration, UTI, hypernatremia. The patient's caregivers at the bedside. Care to reports the patient has had decreased by mouth intake over the past 2 days. Denies any fevers or chills. No recent falls. Says he's also had decreased urinary output.Presented to the ER because of persistent symptoms. Hemodynamically stable on presentation. Afebrile. Satting > 97% on room air. White blood cell count 8.1. urinalysis indicative of infection. Patient is started on Rocephin. Start on IV fluids as well.   Hospital Course:  UTI:  Hypernatremia: -  likely secondary to above. Check urine osm. Hydrate pt. With  IV fluids. - Rocephin. Pan cutlure. Continue to follow.  - Pt was DNR/DNI. Pt was found breathless, pulseless, and pronounced deceased  Time: 40 minutes  Signed:  Marinda ElkFELIZ ORTIZ, Remedios Mckone  [facility] December 30, 2013, 10:21 AM

## 2013-08-25 DEATH — deceased

## 2013-08-27 ENCOUNTER — Ambulatory Visit: Payer: Medicare HMO | Admitting: Podiatry
